# Patient Record
Sex: Female | Born: 1970 | Race: Black or African American | Hispanic: No | Marital: Married | Smoking: Never smoker
Health system: Southern US, Community
[De-identification: ages and names within clinical notes are randomized; demographics above are authoritative.]

## PROBLEM LIST (undated history)

## (undated) DIAGNOSIS — D509 Iron deficiency anemia, unspecified: Secondary | ICD-10-CM

## (undated) DIAGNOSIS — I509 Heart failure, unspecified: Secondary | ICD-10-CM

## (undated) DIAGNOSIS — I10 Essential (primary) hypertension: Secondary | ICD-10-CM

## (undated) DIAGNOSIS — Z91148 Patient's other noncompliance with medication regimen for other reason: Secondary | ICD-10-CM

## (undated) DIAGNOSIS — Q6211 Congenital occlusion of ureteropelvic junction: Secondary | ICD-10-CM

## (undated) DIAGNOSIS — R7989 Other specified abnormal findings of blood chemistry: Secondary | ICD-10-CM

## (undated) DIAGNOSIS — I429 Cardiomyopathy, unspecified: Secondary | ICD-10-CM

## (undated) DIAGNOSIS — Z9114 Patient's other noncompliance with medication regimen: Secondary | ICD-10-CM

## (undated) DIAGNOSIS — E119 Type 2 diabetes mellitus without complications: Secondary | ICD-10-CM

## (undated) DIAGNOSIS — N179 Acute kidney failure, unspecified: Secondary | ICD-10-CM

## (undated) DIAGNOSIS — I161 Hypertensive emergency: Secondary | ICD-10-CM

## (undated) DIAGNOSIS — I5041 Acute combined systolic (congestive) and diastolic (congestive) heart failure: Secondary | ICD-10-CM

## (undated) DIAGNOSIS — E1129 Type 2 diabetes mellitus with other diabetic kidney complication: Secondary | ICD-10-CM

## (undated) HISTORY — DX: Cardiomyopathy, unspecified: I42.9

## (undated) HISTORY — PX: CHOLECYSTECTOMY: SHX55

## (undated) HISTORY — DX: Type 2 diabetes mellitus with other diabetic kidney complication: E11.29

## (undated) HISTORY — DX: Congenital occlusion of ureteropelvic junction: Q62.11

## (undated) HISTORY — DX: Hypertensive emergency: I16.1

## (undated) HISTORY — DX: Heart failure, unspecified: I50.9

## (undated) HISTORY — DX: Other specified abnormal findings of blood chemistry: R79.89

## (undated) HISTORY — DX: Iron deficiency anemia, unspecified: D50.9

## (undated) HISTORY — DX: Acute kidney failure, unspecified: N17.9

## (undated) HISTORY — PX: OVARIAN CYST SURGERY: SHX726

## (undated) HISTORY — DX: Acute combined systolic (congestive) and diastolic (congestive) heart failure: I50.41

---

## 2010-02-15 ENCOUNTER — Ambulatory Visit (HOSPITAL_COMMUNITY)
Admission: RE | Admit: 2010-02-15 | Discharge: 2010-02-15 | Payer: Self-pay | Source: Home / Self Care | Attending: Ophthalmology | Admitting: Ophthalmology

## 2010-05-01 LAB — SURGICAL PCR SCREEN
MRSA, PCR: NEGATIVE
Staphylococcus aureus: NEGATIVE

## 2010-05-01 LAB — BASIC METABOLIC PANEL
BUN: 24 mg/dL — ABNORMAL HIGH (ref 6–23)
Calcium: 9.7 mg/dL (ref 8.4–10.5)
GFR calc Af Amer: 44 mL/min — ABNORMAL LOW (ref >60)
Potassium: 4.1 mEq/L (ref 3.5–5.1)
Sodium: 136 mEq/L (ref 135–145)

## 2010-05-01 LAB — GLUCOSE, CAPILLARY
Glucose-Capillary: 150 mg/dL — ABNORMAL HIGH (ref 70–99)
Glucose-Capillary: 162 mg/dL — ABNORMAL HIGH (ref 70–99)
Glucose-Capillary: 90 mg/dL (ref 70–99)

## 2010-05-01 LAB — CBC
Hemoglobin: 11.2 g/dL — ABNORMAL LOW (ref 12.0–15.0)
Platelets: 331 10*3/uL (ref 150–400)

## 2016-10-26 ENCOUNTER — Encounter (HOSPITAL_BASED_OUTPATIENT_CLINIC_OR_DEPARTMENT_OTHER): Payer: Self-pay | Admitting: *Deleted

## 2016-10-26 ENCOUNTER — Emergency Department (HOSPITAL_BASED_OUTPATIENT_CLINIC_OR_DEPARTMENT_OTHER): Payer: 59

## 2016-10-26 ENCOUNTER — Inpatient Hospital Stay (HOSPITAL_BASED_OUTPATIENT_CLINIC_OR_DEPARTMENT_OTHER)
Admission: EM | Admit: 2016-10-26 | Discharge: 2016-10-30 | DRG: 304 | Disposition: A | Discharge status: Home / Self Care | Payer: 59 | Attending: Internal Medicine | Admitting: Internal Medicine

## 2016-10-26 DIAGNOSIS — I248 Other forms of acute ischemic heart disease: Secondary | ICD-10-CM | POA: Diagnosis present

## 2016-10-26 DIAGNOSIS — I161 Hypertensive emergency: Principal | ICD-10-CM | POA: Diagnosis present

## 2016-10-26 DIAGNOSIS — N390 Urinary tract infection, site not specified: Secondary | ICD-10-CM | POA: Diagnosis not present

## 2016-10-26 DIAGNOSIS — D509 Iron deficiency anemia, unspecified: Secondary | ICD-10-CM | POA: Diagnosis present

## 2016-10-26 DIAGNOSIS — N186 End stage renal disease: Secondary | ICD-10-CM | POA: Diagnosis not present

## 2016-10-26 DIAGNOSIS — E1122 Type 2 diabetes mellitus with diabetic chronic kidney disease: Secondary | ICD-10-CM | POA: Diagnosis present

## 2016-10-26 DIAGNOSIS — N171 Acute kidney failure with acute cortical necrosis: Secondary | ICD-10-CM | POA: Diagnosis not present

## 2016-10-26 DIAGNOSIS — Z9119 Patient's noncompliance with other medical treatment and regimen: Secondary | ICD-10-CM | POA: Diagnosis not present

## 2016-10-26 DIAGNOSIS — I5041 Acute combined systolic (congestive) and diastolic (congestive) heart failure: Secondary | ICD-10-CM | POA: Diagnosis present

## 2016-10-26 DIAGNOSIS — N179 Acute kidney failure, unspecified: Secondary | ICD-10-CM | POA: Diagnosis present

## 2016-10-26 DIAGNOSIS — Q6211 Congenital occlusion of ureteropelvic junction: Secondary | ICD-10-CM

## 2016-10-26 DIAGNOSIS — J810 Acute pulmonary edema: Secondary | ICD-10-CM

## 2016-10-26 DIAGNOSIS — I5043 Acute on chronic combined systolic (congestive) and diastolic (congestive) heart failure: Secondary | ICD-10-CM | POA: Diagnosis not present

## 2016-10-26 DIAGNOSIS — I132 Hypertensive heart and chronic kidney disease with heart failure and with stage 5 chronic kidney disease, or end stage renal disease: Secondary | ICD-10-CM | POA: Diagnosis present

## 2016-10-26 DIAGNOSIS — D631 Anemia in chronic kidney disease: Secondary | ICD-10-CM | POA: Diagnosis present

## 2016-10-26 DIAGNOSIS — I120 Hypertensive chronic kidney disease with stage 5 chronic kidney disease or end stage renal disease: Secondary | ICD-10-CM | POA: Diagnosis not present

## 2016-10-26 DIAGNOSIS — N2581 Secondary hyperparathyroidism of renal origin: Secondary | ICD-10-CM | POA: Diagnosis present

## 2016-10-26 DIAGNOSIS — I509 Heart failure, unspecified: Secondary | ICD-10-CM

## 2016-10-26 DIAGNOSIS — I110 Hypertensive heart disease with heart failure: Secondary | ICD-10-CM | POA: Diagnosis not present

## 2016-10-26 DIAGNOSIS — Z9114 Patient's other noncompliance with medication regimen: Secondary | ICD-10-CM | POA: Diagnosis not present

## 2016-10-26 DIAGNOSIS — N136 Pyonephrosis: Secondary | ICD-10-CM | POA: Diagnosis present

## 2016-10-26 DIAGNOSIS — E872 Acidosis: Secondary | ICD-10-CM | POA: Diagnosis present

## 2016-10-26 DIAGNOSIS — Z79899 Other long term (current) drug therapy: Secondary | ICD-10-CM

## 2016-10-26 DIAGNOSIS — E1165 Type 2 diabetes mellitus with hyperglycemia: Secondary | ICD-10-CM | POA: Diagnosis present

## 2016-10-26 DIAGNOSIS — I5082 Biventricular heart failure: Secondary | ICD-10-CM | POA: Diagnosis present

## 2016-10-26 DIAGNOSIS — I340 Nonrheumatic mitral (valve) insufficiency: Secondary | ICD-10-CM | POA: Diagnosis not present

## 2016-10-26 DIAGNOSIS — E1121 Type 2 diabetes mellitus with diabetic nephropathy: Secondary | ICD-10-CM

## 2016-10-26 DIAGNOSIS — R7989 Other specified abnormal findings of blood chemistry: Secondary | ICD-10-CM

## 2016-10-26 DIAGNOSIS — R778 Other specified abnormalities of plasma proteins: Secondary | ICD-10-CM

## 2016-10-26 DIAGNOSIS — N170 Acute kidney failure with tubular necrosis: Secondary | ICD-10-CM | POA: Diagnosis not present

## 2016-10-26 DIAGNOSIS — I5021 Acute systolic (congestive) heart failure: Secondary | ICD-10-CM | POA: Diagnosis not present

## 2016-10-26 DIAGNOSIS — R748 Abnormal levels of other serum enzymes: Secondary | ICD-10-CM | POA: Diagnosis not present

## 2016-10-26 DIAGNOSIS — E1129 Type 2 diabetes mellitus with other diabetic kidney complication: Secondary | ICD-10-CM | POA: Diagnosis present

## 2016-10-26 DIAGNOSIS — D649 Anemia, unspecified: Secondary | ICD-10-CM | POA: Diagnosis not present

## 2016-10-26 DIAGNOSIS — N185 Chronic kidney disease, stage 5: Secondary | ICD-10-CM | POA: Diagnosis present

## 2016-10-26 HISTORY — DX: Essential (primary) hypertension: I10

## 2016-10-26 HISTORY — DX: Type 2 diabetes mellitus without complications: E11.9

## 2016-10-26 HISTORY — DX: Patient's other noncompliance with medication regimen for other reason: Z91.148

## 2016-10-26 HISTORY — DX: Patient's other noncompliance with medication regimen: Z91.14

## 2016-10-26 LAB — CBG MONITORING, ED: GLUCOSE-CAPILLARY: 157 mg/dL — AB (ref 65–99)

## 2016-10-26 LAB — COMPREHENSIVE METABOLIC PANEL
ALK PHOS: 110 U/L (ref 38–126)
ALT: 28 U/L (ref 14–54)
AST: 23 U/L (ref 15–41)
Albumin: 4.5 g/dL (ref 3.5–5.0)
Anion gap: 14 (ref 5–15)
BUN: 61 mg/dL — ABNORMAL HIGH (ref 6–20)
CALCIUM: 9.8 mg/dL (ref 8.9–10.3)
CHLORIDE: 103 mmol/L (ref 101–111)
CO2: 19 mmol/L — AB (ref 22–32)
CREATININE: 5.91 mg/dL — AB (ref 0.44–1.00)
GFR calc Af Amer: 9 mL/min — ABNORMAL LOW (ref >60)
GFR calc non Af Amer: 8 mL/min — ABNORMAL LOW (ref >60)
GLUCOSE: 194 mg/dL — AB (ref 65–99)
Potassium: 3.4 mmol/L — ABNORMAL LOW (ref 3.5–5.1)
SODIUM: 136 mmol/L (ref 135–145)
TOTAL PROTEIN: 8.2 g/dL — AB (ref 6.5–8.1)
Total Bilirubin: 1.4 mg/dL — ABNORMAL HIGH (ref 0.3–1.2)

## 2016-10-26 LAB — CBC WITH DIFFERENTIAL/PLATELET
BASOS ABS: 0 10*3/uL (ref 0.0–0.1)
Basophils Relative: 0 %
EOS ABS: 0.1 10*3/uL (ref 0.0–0.7)
EOS PCT: 1 %
HCT: 33.5 % — ABNORMAL LOW (ref 36.0–46.0)
HEMOGLOBIN: 11.1 g/dL — AB (ref 12.0–15.0)
LYMPHS PCT: 22 %
Lymphs Abs: 2.2 10*3/uL (ref 0.7–4.0)
MCH: 23.8 pg — ABNORMAL LOW (ref 26.0–34.0)
MCHC: 33.1 g/dL (ref 30.0–36.0)
MCV: 71.7 fL — AB (ref 78.0–100.0)
Monocytes Absolute: 0.6 10*3/uL (ref 0.1–1.0)
Monocytes Relative: 6 %
NEUTROS PCT: 71 %
Neutro Abs: 7.1 10*3/uL (ref 1.7–7.7)
PLATELETS: 323 10*3/uL (ref 150–400)
RBC: 4.67 MIL/uL (ref 3.87–5.11)
RDW: 16.6 % — ABNORMAL HIGH (ref 11.5–15.5)
WBC: 10 10*3/uL (ref 4.0–10.5)

## 2016-10-26 LAB — D-DIMER, QUANTITATIVE: D-Dimer, Quant: 1.36 ug/mL-FEU — ABNORMAL HIGH (ref 0.00–0.50)

## 2016-10-26 LAB — TROPONIN I: Troponin I: 0.25 ng/mL (ref <0.03)

## 2016-10-26 LAB — LIPASE, BLOOD: Lipase: 44 U/L (ref 11–51)

## 2016-10-26 LAB — BRAIN NATRIURETIC PEPTIDE

## 2016-10-26 MED ORDER — LABETALOL HCL 5 MG/ML IV SOLN
0.5000 mg/min | INTRAVENOUS | Status: DC
  Administered 2016-10-26: 0.5 mg/min via INTRAVENOUS
  Filled 2016-10-26: qty 100

## 2016-10-26 MED ORDER — NITROGLYCERIN IN D5W 200-5 MCG/ML-% IV SOLN
0.0000 ug/min | Freq: Once | INTRAVENOUS | Status: AC
  Administered 2016-10-26: 5 ug/min via INTRAVENOUS
  Filled 2016-10-26: qty 250

## 2016-10-26 MED ORDER — METOPROLOL TARTRATE 5 MG/5ML IV SOLN
5.0000 mg | Freq: Once | INTRAVENOUS | Status: AC
  Administered 2016-10-26: 5 mg via INTRAVENOUS
  Filled 2016-10-26: qty 5

## 2016-10-26 MED ORDER — ASPIRIN 81 MG PO CHEW
324.0000 mg | CHEWABLE_TABLET | Freq: Once | ORAL | Status: AC
  Administered 2016-10-26: 324 mg via ORAL
  Filled 2016-10-26: qty 4

## 2016-10-26 MED ORDER — NITROGLYCERIN 0.4 MG SL SUBL
0.4000 mg | SUBLINGUAL_TABLET | SUBLINGUAL | Status: DC | PRN
  Filled 2016-10-26 (×2): qty 1

## 2016-10-26 NOTE — ED Notes (Signed)
MD made aware of target BP reached, informed to continue labetalol drip at this time.

## 2016-10-26 NOTE — ED Notes (Signed)
Date and time results received: 10/26/16 2110 Face to face  Test: troponin Critical Value: 0.25  Name of Provider Notified: Dr. Lita Mains  Orders Received? Or Actions Taken?: Dr. Lita Mains to room

## 2016-10-26 NOTE — ED Notes (Signed)
Pt. Feels full when she eats only a few bites of food and her abd. Begins to hurt also.  Pt. Does drink fluids.

## 2016-10-26 NOTE — ED Notes (Signed)
[facility] notified - hospitalist consult @ [facility]. Cancelled transport from [place] - Paged hospitalist @ [facility] - Dr. Lita Mains will cancel admission

## 2016-10-26 NOTE — ED Notes (Signed)
ED Provider at bedside. 

## 2016-10-26 NOTE — ED Notes (Signed)
EDP in to reassess the Pt. And aware of Pt. Troponin level.  Pt. In no distress.

## 2016-10-26 NOTE — ED Triage Notes (Signed)
Abdominal pain and SOB for a week. She took her BP tonight and it was elevated. She stopped taking her BP and Diabetic medications 2 years ago.

## 2016-10-26 NOTE — ED Provider Notes (Signed)
[facility]PT [facility] Provider Note   CSN: [id number] Arrival date & time: 10/26/16  1942     History   Chief Complaint Chief Complaint  Patient presents with  . Abdominal Pain    HPI Mallory Collier is a 46 y.o. female.  HPI Patient states she's been off of her diabetic and hypertensive medication for the past 2 years. She's had 2 weeks of upper abdominal discomfort and fullness. She's also had shortness of breath worse with exertion. Denies cough, fever or chills. No new lower extremity swelling or pain. Denies chest pain or tightness. States she's had early satiety due to upper abdominal discomfort. Took her blood pressure today and it was elevated. Denies use of stimulants. She has had 5 pound weight loss this week. Past Medical History:  Diagnosis Date  . Diabetes mellitus without complication ([place])   . Hypertension   . Noncompliance with medication regimen     Patient Active Problem List   Diagnosis Date Noted  . Hypertensive urgency 10/26/2016    Past Surgical History:  Procedure Laterality Date  . CHOLECYSTECTOMY    . OVARIAN CYST SURGERY      OB History    No data available       Home Medications    Prior to Admission medications   Not on File    Family History No family history on file.  Social History Social History  Substance Use Topics  . Smoking status: Never Smoker  . Smokeless tobacco: Never Used  . Alcohol use No     Allergies   Patient has no known allergies.   Review of Systems Review of Systems  Constitutional: Positive for activity change, appetite change and fatigue. Negative for chills and fever.  HENT: Negative for congestion, sinus pain and sore throat.   Eyes: Negative for visual disturbance.  Respiratory: Positive for shortness of breath. Negative for cough and wheezing.   Cardiovascular: Positive for palpitations. Negative for chest pain and leg swelling.  Gastrointestinal: Positive for abdominal distention,  abdominal pain, constipation and nausea. Negative for diarrhea and vomiting.  Genitourinary: Negative for dysuria, flank pain, frequency and hematuria.  Musculoskeletal: Negative for back pain, myalgias, neck pain and neck stiffness.  Skin: Negative for rash and wound.  Neurological: Negative for dizziness, weakness, light-headedness, numbness and headaches.  All other systems reviewed and are negative.    Physical Exam Updated Vital Signs BP (!) 169/108 (BP Location: Right Arm)   Pulse 70   Temp 98.2 F (36.8 C) (Oral)   Resp 18   Ht 5\' 5"  (1.651 m)   Wt 74.8 kg (165 lb)   LMP 10/12/2016   SpO2 100%   BMI 27.46 kg/m   Physical Exam  Constitutional: She is oriented to person, place, and time. She appears well-developed and well-nourished. No distress.  HENT:  Head: Normocephalic and atraumatic.  Mouth/Throat: Oropharynx is clear and moist. No oropharyngeal exudate.  Eyes: Pupils are equal, round, and reactive to light. EOM are normal.  Neck: Normal range of motion. Neck supple. No JVD present. No thyromegaly present.  Cardiovascular: Regular rhythm.  Exam reveals no gallop and no friction rub.   No murmur heard. Tachycardia  Pulmonary/Chest: Effort normal and breath sounds normal. No respiratory distress. She has no wheezes. She has no rales. She exhibits no tenderness.  Abdominal: Soft. Bowel sounds are normal. She exhibits no distension and no mass. There is no tenderness. There is no rebound and no guarding. No hernia.  Musculoskeletal: Normal range  of motion. She exhibits no edema or tenderness.  No lower extremity swelling or asymmetry. Distal pulses are 2+. No midline thoracic or lumbar tenderness. No CVA tenderness.  Lymphadenopathy:    She has no cervical adenopathy.  Neurological: She is alert and oriented to person, place, and time.  Meloxicam and is without focal deficit. Sensation fully intact.  Skin: Skin is warm and dry. No rash noted. She is not diaphoretic. No  erythema.  Psychiatric: She has a normal mood and affect. Her behavior is normal.  Nursing note and vitals reviewed.    ED Treatments / Results  Labs (all labs ordered are listed, but only abnormal results are displayed) Labs Reviewed  CBC WITH DIFFERENTIAL/PLATELET - Abnormal; Notable for the following:       Result Value   Hemoglobin 11.1 (*)    HCT 33.5 (*)    MCV 71.7 (*)    MCH 23.8 (*)    RDW 16.6 (*)    All other components within normal limits  COMPREHENSIVE METABOLIC PANEL - Abnormal; Notable for the following:    Potassium 3.4 (*)    CO2 19 (*)    Glucose, Bld 194 (*)    BUN 61 (*)    Creatinine, Ser 5.91 (*)    Total Protein 8.2 (*)    Total Bilirubin 1.4 (*)    GFR calc non Af Amer 8 (*)    GFR calc Af Amer 9 (*)    All other components within normal limits  TROPONIN I - Abnormal; Notable for the following:    Troponin I 0.25 (*)    All other components within normal limits  BRAIN NATRIURETIC PEPTIDE - Abnormal; Notable for the following:    B Natriuretic Peptide >4,500.0 (*)    All other components within normal limits  D-DIMER, QUANTITATIVE (NOT AT [facility]) - Abnormal; Notable for the following:    D-Dimer, Quant 1.36 (*)    All other components within normal limits  CBG MONITORING, ED - Abnormal; Notable for the following:    Glucose-Capillary 157 (*)    All other components within normal limits  LIPASE, BLOOD  TSH  RAPID URINE DRUG SCREEN, HOSP PERFORMED  T4, FREE  URINALYSIS, ROUTINE W REFLEX MICROSCOPIC  CBG MONITORING, ED    EKG  EKG Interpretation None       Radiology Dg Chest Port 1 View  Result Date: 10/26/2016 CLINICAL DATA:  46 year old female with history of acute onset of hypertension today. EXAM: PORTABLE CHEST 1 VIEW COMPARISON:  Chest x-ray a 02/07/2010. FINDINGS: There is mild cephalization of the pulmonary vasculature and slight indistinctness of the interstitial markings suggestive of mild pulmonary edema. No acute consolidative  airspace disease. No definite pleural effusions. Mild cardiomegaly. The patient is rotated to the left on today's exam, resulting in distortion of the mediastinal contours and reduced diagnostic sensitivity and specificity for mediastinal pathology. IMPRESSION: 1. The appearance of the chest suggests very mild congestive heart failure, as above. Electronically Signed   By: Vinnie Langton M.D.   On: 10/26/2016 21:03    Procedures Procedures (including critical care time)  Medications Ordered in ED Medications  nitroGLYCERIN (NITROSTAT) SL tablet 0.4 mg (0 mg Sublingual Hold 10/26/16 2122)  metoprolol tartrate (LOPRESSOR) injection 5 mg (5 mg Intravenous Given 10/26/16 2042)  aspirin chewable tablet 324 mg (324 mg Oral Given 10/26/16 2040)  nitroGLYCERIN 50 mg in dextrose 5 % 250 mL (0.2 mg/mL) infusion (85 mcg/min Intravenous Rate/Dose Change 10/27/16 0014)   CRITICAL  CARE Performed by: Julianne Rice Total critical care time: 40 minutes Critical care time was exclusive of separately billable procedures and treating other patients. Critical care was necessary to treat or prevent imminent or life-threatening deterioration. Critical care was time spent personally by me on the following activities: development of treatment plan with patient and/or surrogate as well as nursing, discussions with consultants, evaluation of patient's response to treatment, examination of patient, obtaining history from patient or surrogate, ordering and performing treatments and interventions, ordering and review of laboratory studies, ordering and review of radiographic studies, pulse oximetry and re-evaluation of patient's condition.  Initial Impression / Assessment and Plan / ED Course  I have reviewed the triage vital signs and the nursing notes.  Pertinent labs & imaging results that were available during my care of the patient were reviewed by me and considered in my medical decision making (see chart for  details).    EKG with concerning findings of ST segment depression in lateral and inferior leads. There is small amount of anterior elevation which appears to be chronic and similar to previous EKG.  Started on labetalol drip with improvement of blood pressure. Continues to deny any chest pain. She has elevation of troponin and acute renal failure with evidence of pulmonary edema on chest x-ray. Discussed with hospitalist who will accept to [facility] step down unit. Suggests switching labetalol drip to nitroglycerin drip. UA is pending.  Final Clinical Impressions(s) / ED Diagnoses   Final diagnoses:  AKI (acute kidney injury) ([place])  Acute pulmonary edema (H[facility])  Elevated troponin  Hypertensive emergency    New Prescriptions New Prescriptions   No medications on file     Julianne Rice, MD 10/27/16 [phone]

## 2016-10-26 NOTE — ED Notes (Signed)
Patient denies pain and is resting comfortably.  

## 2016-10-26 NOTE — ED Notes (Signed)
Pt. Is in no distress and has no resp. Issues.

## 2016-10-26 NOTE — ED Notes (Signed)
Target B/P is noted on med instructions.

## 2016-10-27 ENCOUNTER — Encounter (HOSPITAL_COMMUNITY): Payer: Self-pay | Admitting: Internal Medicine

## 2016-10-27 ENCOUNTER — Inpatient Hospital Stay (HOSPITAL_COMMUNITY): Payer: 59

## 2016-10-27 DIAGNOSIS — E1121 Type 2 diabetes mellitus with diabetic nephropathy: Secondary | ICD-10-CM

## 2016-10-27 DIAGNOSIS — N390 Urinary tract infection, site not specified: Secondary | ICD-10-CM

## 2016-10-27 DIAGNOSIS — N170 Acute kidney failure with tubular necrosis: Secondary | ICD-10-CM

## 2016-10-27 DIAGNOSIS — I509 Heart failure, unspecified: Secondary | ICD-10-CM

## 2016-10-27 DIAGNOSIS — I161 Hypertensive emergency: Secondary | ICD-10-CM

## 2016-10-27 DIAGNOSIS — E1129 Type 2 diabetes mellitus with other diabetic kidney complication: Secondary | ICD-10-CM

## 2016-10-27 DIAGNOSIS — R7989 Other specified abnormal findings of blood chemistry: Secondary | ICD-10-CM

## 2016-10-27 DIAGNOSIS — D509 Iron deficiency anemia, unspecified: Secondary | ICD-10-CM | POA: Diagnosis present

## 2016-10-27 DIAGNOSIS — D649 Anemia, unspecified: Secondary | ICD-10-CM

## 2016-10-27 DIAGNOSIS — R778 Other specified abnormalities of plasma proteins: Secondary | ICD-10-CM

## 2016-10-27 DIAGNOSIS — I340 Nonrheumatic mitral (valve) insufficiency: Secondary | ICD-10-CM

## 2016-10-27 DIAGNOSIS — N179 Acute kidney failure, unspecified: Secondary | ICD-10-CM

## 2016-10-27 HISTORY — DX: Type 2 diabetes mellitus with other diabetic kidney complication: E11.29

## 2016-10-27 HISTORY — DX: Other specified abnormal findings of blood chemistry: R79.89

## 2016-10-27 HISTORY — DX: Iron deficiency anemia, unspecified: D50.9

## 2016-10-27 HISTORY — DX: Acute kidney failure, unspecified: N17.9

## 2016-10-27 HISTORY — DX: Other specified abnormalities of plasma proteins: R77.8

## 2016-10-27 HISTORY — DX: Hypertensive emergency: I16.1

## 2016-10-27 HISTORY — DX: Heart failure, unspecified: I50.9

## 2016-10-27 LAB — URINALYSIS, ROUTINE W REFLEX MICROSCOPIC
Bilirubin Urine: NEGATIVE
Glucose, UA: 250 mg/dL — AB
Ketones, ur: NEGATIVE mg/dL
Nitrite: NEGATIVE
Protein, ur: >300 mg/dL — AB
SPECIFIC GRAVITY, URINE: 1.02 (ref 1.005–1.030)
pH: 6 (ref 5.0–8.0)

## 2016-10-27 LAB — GLUCOSE, CAPILLARY
GLUCOSE-CAPILLARY: 137 mg/dL — AB (ref 65–99)
GLUCOSE-CAPILLARY: 149 mg/dL — AB (ref 65–99)
Glucose-Capillary: 148 mg/dL — ABNORMAL HIGH (ref 65–99)
Glucose-Capillary: 133 mg/dL — ABNORMAL HIGH (ref 65–99)

## 2016-10-27 LAB — ECHOCARDIOGRAM COMPLETE
Height: 65 in
Weight: 2543.23 oz

## 2016-10-27 LAB — CBC WITH DIFFERENTIAL/PLATELET
Basophils Absolute: 0 10*3/uL (ref 0.0–0.1)
Basophils Relative: 1 %
Eosinophils Absolute: 0.1 10*3/uL (ref 0.0–0.7)
Eosinophils Relative: 1 %
HEMATOCRIT: 30.6 % — AB (ref 36.0–46.0)
HEMOGLOBIN: 9.5 g/dL — AB (ref 12.0–15.0)
LYMPHS ABS: 1.9 10*3/uL (ref 0.7–4.0)
LYMPHS PCT: 22 %
MCH: 22.9 pg — AB (ref 26.0–34.0)
MCHC: 31 g/dL (ref 30.0–36.0)
MCV: 73.7 fL — AB (ref 78.0–100.0)
MONO ABS: 0.4 10*3/uL (ref 0.1–1.0)
MONOS PCT: 5 %
NEUTROS ABS: 6.2 10*3/uL (ref 1.7–7.7)
Neutrophils Relative %: 71 %
Platelets: 260 10*3/uL (ref 150–400)
RBC: 4.15 MIL/uL (ref 3.87–5.11)
RDW: 16.7 % — AB (ref 11.5–15.5)
WBC: 8.7 10*3/uL (ref 4.0–10.5)

## 2016-10-27 LAB — HEPATIC FUNCTION PANEL
ALT: 25 U/L (ref 14–54)
AST: 22 U/L (ref 15–41)
Albumin: 3.8 g/dL (ref 3.5–5.0)
Alkaline Phosphatase: 103 U/L (ref 38–126)
BILIRUBIN DIRECT: 0.3 mg/dL (ref 0.1–0.5)
Indirect Bilirubin: 1.2 mg/dL — ABNORMAL HIGH (ref 0.3–0.9)
TOTAL PROTEIN: 7.4 g/dL (ref 6.5–8.1)
Total Bilirubin: 1.5 mg/dL — ABNORMAL HIGH (ref 0.3–1.2)

## 2016-10-27 LAB — TSH: TSH: 1.901 u[IU]/mL (ref 0.350–4.500)

## 2016-10-27 LAB — TROPONIN I
TROPONIN I: 0.47 ng/mL — AB (ref <0.03)
Troponin I: 0.24 ng/mL (ref <0.03)
Troponin I: 0.25 ng/mL (ref <0.03)
Troponin I: 0.34 ng/mL (ref <0.03)

## 2016-10-27 LAB — RAPID URINE DRUG SCREEN, HOSP PERFORMED
AMPHETAMINES: NOT DETECTED
BENZODIAZEPINES: NOT DETECTED
Barbiturates: NOT DETECTED
Cocaine: NOT DETECTED
OPIATES: NOT DETECTED
Tetrahydrocannabinol: NOT DETECTED

## 2016-10-27 LAB — IRON AND TIBC
Iron: 26 ug/dL — ABNORMAL LOW (ref 28–170)
Saturation Ratios: 6 % — ABNORMAL LOW (ref 10.4–31.8)
TIBC: 402 ug/dL (ref 250–450)
UIBC: 376 ug/dL

## 2016-10-27 LAB — HEMOGLOBIN A1C
Hgb A1c MFr Bld: 6.3 % — ABNORMAL HIGH (ref 4.8–5.6)
MEAN PLASMA GLUCOSE: 134.11 mg/dL

## 2016-10-27 LAB — RETICULOCYTES
RBC.: 4.15 MIL/uL (ref 3.87–5.11)
Retic Count, Absolute: 83 10*3/uL (ref 19.0–186.0)
Retic Ct Pct: 2 % (ref 0.4–3.1)

## 2016-10-27 LAB — CREATININE, URINE, RANDOM: Creatinine, Urine: 117.26 mg/dL

## 2016-10-27 LAB — URINALYSIS, MICROSCOPIC (REFLEX)

## 2016-10-27 LAB — LIPASE, BLOOD: Lipase: 46 U/L (ref 11–51)

## 2016-10-27 LAB — VITAMIN B12: VITAMIN B 12: 1409 pg/mL — AB (ref 180–914)

## 2016-10-27 LAB — BASIC METABOLIC PANEL
ANION GAP: 15 (ref 5–15)
BUN: 61 mg/dL — ABNORMAL HIGH (ref 6–20)
CALCIUM: 9.7 mg/dL (ref 8.9–10.3)
CHLORIDE: 105 mmol/L (ref 101–111)
CO2: 17 mmol/L — AB (ref 22–32)
Creatinine, Ser: 6.2 mg/dL — ABNORMAL HIGH (ref 0.44–1.00)
GFR calc Af Amer: 8 mL/min — ABNORMAL LOW (ref >60)
GFR calc non Af Amer: 7 mL/min — ABNORMAL LOW (ref >60)
GLUCOSE: 155 mg/dL — AB (ref 65–99)
Potassium: 3.7 mmol/L (ref 3.5–5.1)
Sodium: 137 mmol/L (ref 135–145)

## 2016-10-27 LAB — CK: Total CK: 72 U/L (ref 38–234)

## 2016-10-27 LAB — FERRITIN: FERRITIN: 21 ng/mL (ref 11–307)

## 2016-10-27 LAB — T4, FREE: Free T4: 1.39 ng/dL — ABNORMAL HIGH (ref 0.61–1.12)

## 2016-10-27 LAB — FOLATE: Folate: 31 ng/mL (ref >5.9)

## 2016-10-27 LAB — HIV ANTIBODY (ROUTINE TESTING W REFLEX): HIV SCREEN 4TH GENERATION: NONREACTIVE

## 2016-10-27 LAB — PREGNANCY, URINE: PREG TEST UR: NEGATIVE

## 2016-10-27 LAB — MRSA PCR SCREENING: MRSA by PCR: NEGATIVE

## 2016-10-27 LAB — PROTEIN / CREATININE RATIO, URINE
CREATININE, URINE: 110.61 mg/dL
Protein Creatinine Ratio: 2.05 mg/mg{Cre} — ABNORMAL HIGH (ref 0.00–0.15)
Total Protein, Urine: 227 mg/dL

## 2016-10-27 LAB — SODIUM, URINE, RANDOM: SODIUM UR: 44 mmol/L

## 2016-10-27 LAB — PHOSPHORUS: Phosphorus: 4.1 mg/dL (ref 2.5–4.6)

## 2016-10-27 LAB — HCG, SERUM, QUALITATIVE: PREG SERUM: NEGATIVE

## 2016-10-27 MED ORDER — SODIUM CHLORIDE 0.9 % IV SOLN
125.0000 mg | Freq: Once | INTRAVENOUS | Status: AC
  Administered 2016-10-27: 125 mg via INTRAVENOUS
  Filled 2016-10-27: qty 10

## 2016-10-27 MED ORDER — LABETALOL HCL 200 MG PO TABS
200.0000 mg | ORAL_TABLET | Freq: Two times a day (BID) | ORAL | Status: DC
  Administered 2016-10-27 – 2016-10-28 (×2): 200 mg via ORAL
  Filled 2016-10-27 (×2): qty 1

## 2016-10-27 MED ORDER — LABETALOL HCL 100 MG PO TABS
100.0000 mg | ORAL_TABLET | Freq: Two times a day (BID) | ORAL | Status: DC
  Administered 2016-10-27: 100 mg via ORAL
  Filled 2016-10-27: qty 1

## 2016-10-27 MED ORDER — NITROGLYCERIN IN D5W 200-5 MCG/ML-% IV SOLN
0.0000 ug/min | Freq: Once | INTRAVENOUS | Status: AC
  Administered 2016-10-27: 150 ug/min via INTRAVENOUS

## 2016-10-27 MED ORDER — HYDRALAZINE HCL 50 MG PO TABS
25.0000 mg | ORAL_TABLET | Freq: Three times a day (TID) | ORAL | Status: DC
  Administered 2016-10-27 – 2016-10-29 (×7): 25 mg via ORAL
  Filled 2016-10-27 (×7): qty 1

## 2016-10-27 MED ORDER — ACETAMINOPHEN 325 MG PO TABS: 650.0000 mg | ORAL_TABLET | Freq: Four times a day (QID) | ORAL | Status: DC | PRN

## 2016-10-27 MED ORDER — AMLODIPINE BESYLATE 10 MG PO TABS
10.0000 mg | ORAL_TABLET | Freq: Every day | ORAL | Status: DC
  Administered 2016-10-27 – 2016-10-30 (×4): 10 mg via ORAL
  Filled 2016-10-27 (×4): qty 1

## 2016-10-27 MED ORDER — LABETALOL HCL 100 MG PO TABS: 150.0000 mg | ORAL_TABLET | Freq: Two times a day (BID) | ORAL | Status: DC

## 2016-10-27 MED ORDER — INSULIN ASPART 100 UNIT/ML ~~LOC~~ SOLN
0.0000 [IU] | Freq: Three times a day (TID) | SUBCUTANEOUS | Status: DC
  Administered 2016-10-27 – 2016-10-29 (×4): 1 [IU] via SUBCUTANEOUS

## 2016-10-27 MED ORDER — DEXTROSE 5 % IV SOLN
1.0000 g | INTRAVENOUS | Status: DC
  Administered 2016-10-27 – 2016-10-29 (×3): 1 g via INTRAVENOUS
  Filled 2016-10-27 (×3): qty 10

## 2016-10-27 MED ORDER — ACETAMINOPHEN 650 MG RE SUPP: 650.0000 mg | Freq: Four times a day (QID) | RECTAL | Status: DC | PRN

## 2016-10-27 MED ORDER — HEPARIN SODIUM (PORCINE) 5000 UNIT/ML IJ SOLN
5000.0000 [IU] | Freq: Three times a day (TID) | INTRAMUSCULAR | Status: DC
  Administered 2016-10-27 – 2016-10-30 (×8): 5000 [IU] via SUBCUTANEOUS
  Filled 2016-10-27 (×8): qty 1

## 2016-10-27 NOTE — Progress Notes (Signed)
ANTICOAGULATION CONSULT NOTE  Pharmacy Consult for heparin Indication: VTE prophylaxis   Assessment: 37 yof admitted with abd discomfort and elevated BP. Pharmacy consulted to dose heparin for VTE prophylaxis. Not on AC pta. Hg down a bit, plt wnl. No bleed documented.  Goal of Therapy:  VTE prevention Monitor platelets by anticoagulation protocol: Yes   Plan:  Heparin 5000 units Metzger q8h Monitor CBC, s/sx bleeding  Elicia Lamp, PharmD, BCPS Clinical Pharmacist 10/27/2016 7:38 PM

## 2016-10-27 NOTE — ED Notes (Signed)
EDP made aware of BP and nitro rate. EDP advised to stop nitro and continue to monitor BP. Pt informed of bed status. Pt has no pain or complaints at this time. Resp e/u. Pt in NAD.

## 2016-10-27 NOTE — H&P (Signed)
History and Physical    Lively Haberman HFW:[id number] DOB: [birth year] DOA: 10/26/2016  PCP: Patient, No Pcp Per  Patient coming from: Home.  Chief Complaint: Abdominal pain and elevated blood pressure.  HPI: Mallory Collier is a 46 y.o. female with history of hypertension and diabetes mellitus has not been to her PCP for more than 2 years presents to the ER with complaints of abdominal discomfort and elevated blood pressure. Patient states over the last 3 days patient has been having abdominal discomfort mostly in the epigastric area. Denies any nausea vomiting or diarrhea. Over the last 2 weeks patient also has been experiencing exertional shortness of breath. Denies any chest pain and productive cough fever or chills. For the last 3 days patient has been taking Aleve for the abdominal discomfort. Though patient has not followed up with her primary care she at times takes Diovan which she still has with her for her blood pressure when she feels that her blood pressure may be running high. After long patient checked her blood pressure yesterday and was found to be elevated.   ED Course: In the ER patient was found to have a systolic blood pressure more than 200 and labs revealed mildly elevated troponin with EKG showing sinus tachycardia and chest x-ray showing congestion. Patient has markedly elevated creatinine the last known creatinine in our system is in 2011 was 1.5. Creatinine today was around 5.9. In addition d-dimer was elevated. UA shows features concerning for UTI. Abdomen on exam his appearing benign. Patient was initially started on labetalol drip and changed to nitroglycerin infusion. At the time of my exam patient's blood pressure is around 180/110. Not in distress. Patient is being admitted for further management of hypertensive emergency with renal failure and possible CHF and elevated troponin and d-dimer.  Review of Systems: As per HPI, rest all negative.   Past Medical History:    Diagnosis Date  . Diabetes mellitus without complication ([place])   . Hypertension   . Noncompliance with medication regimen     Past Surgical History:  Procedure Laterality Date  . CHOLECYSTECTOMY    . OVARIAN CYST SURGERY       reports that she has never smoked. She has never used smokeless tobacco. She reports that she does not drink alcohol or use drugs.  No Known Allergies  Family History  Problem Relation Age of Onset  . Congestive Heart Failure Father     Prior to Admission medications   Not on File    Physical Exam: Vitals:   10/27/16 0142 10/27/16 0200 10/27/16 0230 10/27/16 0342  BP: (!) 170/111 (!) 156/113 (!) 160/101 (!) 176/118  Pulse: 73 72 72 80  Resp: 12 17 20 15   Temp:    98 F (36.7 C)  TempSrc:    Oral  SpO2: 99% 95% 94% 97%  Weight:    72.1 kg (158 lb 15.2 oz)  Height:    5\' 5"  (1.651 m)      Constitutional: Moderately built and nourished. Vitals:   10/27/16 0142 10/27/16 0200 10/27/16 0230 10/27/16 0342  BP: (!) 170/111 (!) 156/113 (!) 160/101 (!) 176/118  Pulse: 73 72 72 80  Resp: 12 17 20 15   Temp:    98 F (36.7 C)  TempSrc:    Oral  SpO2: 99% 95% 94% 97%  Weight:    72.1 kg (158 lb 15.2 oz)  Height:    5\' 5"  (1.651 m)   Eyes: Anicteric no pallor. ENMT: No discharge from  the ears eyes nose and mouth. Neck: No mass felt. No neck rigidity. JVD elevated. Respiratory: No rhonchi or crepitations. Cardiovascular: S1-S2 heard no murmurs appreciated. Abdomen: Soft nontender bowel sounds present. No guarding or rigidity. Musculoskeletal: No edema. No joint effusion. Skin: No rash. Skin appears warm. Neurologic: Alert awake oriented to time place and person. Moves all extremities. Psychiatric: Appears normal. Normal affect.   Labs on Admission: I have personally reviewed following labs and imaging studies  CBC:  Recent Labs Lab 10/26/16 2026  WBC 10.0  NEUTROABS 7.1  HGB 11.1*  HCT 33.5*  MCV 71.7*  PLT 810   Basic Metabolic  Panel:  Recent Labs Lab 10/26/16 2026  NA 136  K 3.4*  CL 103  CO2 19*  GLUCOSE 194*  BUN 61*  CREATININE 5.91*  CALCIUM 9.8   GFR: Estimated Creatinine Clearance: 11.8 mL/min (A) (by C-G formula based on SCr of 5.91 mg/dL (H)). Liver Function Tests:  Recent Labs Lab 10/26/16 2026  AST 23  ALT 28  ALKPHOS 110  BILITOT 1.4*  PROT 8.2*  ALBUMIN 4.5    Recent Labs Lab 10/26/16 2026  LIPASE 44   No results for input(s): AMMONIA in the last 168 hours. Coagulation Profile: No results for input(s): INR, PROTIME in the last 168 hours. Cardiac Enzymes:  Recent Labs Lab 10/26/16 2026 10/27/16 0300  TROPONINI 0.25* 0.24*   BNP (last 3 results) No results for input(s): PROBNP in the last 8760 hours. HbA1C: No results for input(s): HGBA1C in the last 72 hours. CBG:  Recent Labs Lab 10/26/16 1947  GLUCAP 157*   Lipid Profile: No results for input(s): CHOL, HDL, LDLCALC, TRIG, CHOLHDL, LDLDIRECT in the last 72 hours. Thyroid Function Tests:  Recent Labs  10/26/16 2026  TSH 1.901  FREET4 1.39*   Anemia Panel: No results for input(s): VITAMINB12, FOLATE, FERRITIN, TIBC, IRON, RETICCTPCT in the last 72 hours. Urine analysis:    Component Value Date/Time   COLORURINE YELLOW 10/27/2016 0120   APPEARANCEUR CLOUDY (A) 10/27/2016 0120   LABSPEC 1.020 10/27/2016 0120   PHURINE 6.0 10/27/2016 0120   GLUCOSEU 250 (A) 10/27/2016 0120   HGBUR MODERATE (A) 10/27/2016 0120   BILIRUBINUR NEGATIVE 10/27/2016 0120   KETONESUR NEGATIVE 10/27/2016 0120   PROTEINUR >300 (A) 10/27/2016 0120   NITRITE NEGATIVE 10/27/2016 0120   LEUKOCYTESUR SMALL (A) 10/27/2016 0120   Sepsis Labs: @LABRCNTIP (procalcitonin:4,lacticidven:4) )No results found for this or any previous visit (from the past 240 hour(s)).   Radiological Exams on Admission: Dg Chest Port 1 View  Result Date: 10/26/2016 CLINICAL DATA:  46 year old female with history of acute onset of hypertension today.  EXAM: PORTABLE CHEST 1 VIEW COMPARISON:  Chest x-ray a 02/07/2010. FINDINGS: There is mild cephalization of the pulmonary vasculature and slight indistinctness of the interstitial markings suggestive of mild pulmonary edema. No acute consolidative airspace disease. No definite pleural effusions. Mild cardiomegaly. The patient is rotated to the left on today's exam, resulting in distortion of the mediastinal contours and reduced diagnostic sensitivity and specificity for mediastinal pathology. IMPRESSION: 1. The appearance of the chest suggests very mild congestive heart failure, as above. Electronically Signed   By: Vinnie Langton M.D.   On: 10/26/2016 21:03    EKG: Independently reviewed. Sinus tachycardia.  Assessment/Plan Principal Problem:   Hypertensive emergency Active Problems:   Elevated troponin   AKI (acute kidney injury) ([place])   CHF (congestive heart failure) (H[facility])   Type 2 diabetes mellitus with renal manifestations ([place])  Microcytic hypochromic anemia    1. Hypertensive emergency - patient is placed on nitroglycerin infusion. I have started patient on hydralazine by mouth 25 mg 3 times a day along with labetalol 100 mg by mouth twice a day. Slowly wean off nitroglycerin infusion and may have to go up on the medications. 2. 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 Patient did take Diovan occasionally and also recently has been taking Aleve. Check FENa. Hold any nephrotoxic medications. Closely follow intake output metabolic panel. Will check SPEP and will CT of the abdomen without contrast since patient also complains of epigastric discomfort which will also rule out any renal obstruction. HIV status and acute hepatitis panel and lipase are pending. Patient will need nephrology consult in a.m. Patient may need Lasix. 3. Possible CHF EF unknown with elevated troponin - may need Lasix will  need nephrology input. Check 2-D echo cycle cardiac markers patient is on aspirin for now. 4. Microcytic hypochromic anemia - check anemia panel follow CBC. Check stool studies. 5. Abnormal thyroid function test. Free T4 is mildly elevated but TSH is normal. 6. Diabetes mellitus type 2 - check hemoglobin A1c. For now I have Patient on sliding scale coverage. 7. Possible UTI - check urine culture patient is on ceftriaxone.  Urine pregnancy screen is pending. If negative I will be ordering CT scan of the abdomen without contrast and VQ scan.   DVT prophylaxis: SCDs for now until blood pressure gets better. Code Status: Full code.  Family Communication: Family at the bedside.  Disposition Plan: Home.  Consults called: None.  Admission status: Inpatient.    Rise Patience MD [facility] Pager [phone].  If 7PM-7AM, please contact night-coverage [email or web address] Password [facility]  10/27/2016, 5:48 AM

## 2016-10-27 NOTE — Progress Notes (Signed)
Admitting hospitalist at bedside at this time.

## 2016-10-27 NOTE — Progress Notes (Signed)
PROGRESS NOTE    Mallory Collier  DVV:[id number] DOB: October 19, 1970 DOA: 10/26/2016 PCP: Patient, No Pcp Per   Brief Narrative:  46 y.o. female PMHx HTN, Diabetes Type 2 uncontrolled, Noncompliance with Medication Regimen, (has not seen PCP in 2 years),   Presents to the ER with complaints of abdominal discomfort and elevated blood pressure. Patient states over the last 3 days patient has been having abdominal discomfort mostly in the epigastric area. Denies any nausea vomiting or diarrhea. Over the last 2 weeks patient also has been experiencing exertional shortness of breath. Denies any chest pain and productive cough fever or chills. For the last 3 days patient has been taking Aleve for the abdominal discomfort. Though patient has not followed up with her primary care she at times takes Diovan which she still has with her for her blood pressure when she feels that her blood pressure may be running high. After long patient checked her blood pressure yesterday and was found to be elevated.    ED Course: In the ER patient was found to have a systolic blood pressure more than 200 and labs revealed mildly elevated troponin with EKG showing sinus tachycardia and chest x-ray showing congestion. Patient has markedly elevated creatinine the last known creatinine in our system is in 2011 was 1.5. Creatinine today was around 5.9. In addition d-dimer was elevated. UA shows features concerning for UTI. Abdomen on exam his appearing benign. Patient was initially started on labetalol drip and changed to nitroglycerin infusion. At the time of my exam patient's blood pressure is around 180/110. Not in distress. Patient is being admitted for further management of hypertensive emergency with renal failure and possible CHF and elevated troponin and d-dimer.    Subjective: 9/8 A/O 4, negative CP, negative SOB, negative abdominal pain, negative N/V.    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 Counseled patient on process for beginning HD. -Will await nephrology to place Vas-Cath, most likely in a.m. for initial HD session.  -SPEP, FENa  pending -HIV, acute hepatitis panel pending -Renal ultrasound pending   CHF? -Echocardiogram pending  Microcytic hypochromic anemia  Abnormal thyroid function  Diabetes type 2 controlled with renal complications -9/8 Hemoglobin A1c= 6.3  UTI -Continue antibiotics 5 days -Urine culture pending  -Urine pregnancy screen is pending. If negative I will be ordering CT scan of the abdomen without contrast and VQ scan.  Goals of care -Patient asked that if family is around we not discuss her current medical situation    DVT prophylaxis: Subcutaneous heparin Code Status: Full Family Communication: None Disposition Plan: TBD   Consultants:  Nephrology  Procedures/Significant Events:  Echocardiogram pending Renal ultrasound pending    I have personally reviewed and interpreted all radiology studies and my findings are as above.  VENTILATOR SETTINGS:    Cultures 9/8 urine pending    Antimicrobials: Anti-infectives    Start     Stop   10/27/16 0600  cefTRIAXone (ROCEPHIN) 1 g in dextrose 5 % 50 mL IVPB             Devices  LINES / TUBES:      Continuous Infusions: . cefTRIAXone (ROCEPHIN)  IV Stopped (10/27/16 0630)     Objective: Vitals:   10/27/16 0230 10/27/16 0342 10/27/16 0613 10/27/16 0730  BP: (!) 160/101 (!) 176/118 (!) 163/97   Pulse: 72 80 77   Resp: 20 15    Temp:  98 F (36.7 C)  98.4 F (36.9 C)    TempSrc:  Oral  Oral  SpO2: 94% 97%    Weight:  158 lb 15.2 oz (72.1 kg)    Height:  5\' 5"  (1.651 m)      Intake/Output Summary (Last 24 hours) at 10/27/16 0847 Last data filed at 10/27/16 0800  Gross per 24 hour  Intake            84.63 ml  Output              200 ml  Net          -115.37 ml   Filed Weights   10/26/16 1944 10/27/16 0342  Weight: 165 lb (74.8 kg) 158 lb 15.2 oz (72.1 kg)    Examination:  General: A/O 4, No acute respiratory distress Neck:  Negative scars, masses, torticollis, lymphadenopathy, JVD Lungs: Clear to auscultation bilaterally without wheezes or crackles Cardiovascular: Regular rate and rhythm without murmur gallop or rub normal S1 and S2 Abdomen: negative abdominal pain, nondistended, positive soft, bowel sounds, no rebound, no ascites, no appreciable mass Extremities: No significant cyanosis, clubbing, or edema bilateral lower extremities Psychiatric:  Negative depression, negative anxiety, negative fatigue, negative mania, poor understanding of current health issues Central nervous system:  Cranial nerves II through XII intact, tongue/uvula midline, all extremities muscle strength 5/5, sensation intact throughout,  negative dysarthria, negative expressive aphasia, negative receptive aphasia.  .     Data Reviewed: Care during the described time interval was provided by me .  I have reviewed this patient's available data, including medical history, events of note, physical examination, and all test results as part of my evaluation.   CBC:  Recent Labs Lab 10/26/16 2026 10/27/16 0536  WBC 10.0 8.7  NEUTROABS 7.1 6.2  HGB 11.1* 9.5*  HCT 33.5* 30.6*  MCV 71.7* 73.7*  PLT 323 782   Basic Metabolic Panel:  Recent Labs Lab 10/26/16 2026 10/27/16 0536  NA 136 137  K 3.4* 3.7  CL 103 105  CO2 19* 17*  GLUCOSE 194* 155*  BUN 61* 61*  CREATININE 5.91* 6.20*  CALCIUM 9.8 9.7  PHOS  --  4.1   GFR: Estimated Creatinine Clearance:  11.3 mL/min (A) (by C-G formula based on SCr of 6.2 mg/dL (H)). Liver Function Tests:  Recent Labs Lab 10/26/16 2026 10/27/16 0536  AST 23 22  ALT 28 25  ALKPHOS 110 103  BILITOT 1.4* 1.5*  PROT 8.2* 7.4  ALBUMIN 4.5 3.8    Recent Labs Lab 10/26/16 2026 10/27/16 0536  LIPASE 44 46   No results for input(s): AMMONIA in the last 168 hours. Coagulation Profile: No results for input(s): INR, PROTIME in the last 168 hours. Cardiac Enzymes:  Recent Labs Lab 10/26/16 2026 10/27/16 0300 10/27/16 0536  CKTOTAL  --   --  72  TROPONINI 0.25* 0.24* 0.25*   BNP (last 3 results) No results for input(s): PROBNP in the last 8760 hours. HbA1C:  Recent Labs  10/27/16 0536  HGBA1C 6.3*   CBG:  Recent Labs Lab 10/26/16 1947 10/27/16 0751  GLUCAP 157* 148*   Lipid Profile: No  results for input(s): CHOL, HDL, LDLCALC, TRIG, CHOLHDL, LDLDIRECT in the last 72 hours. Thyroid Function Tests:  Recent Labs  10/26/16 2026  TSH 1.901  FREET4 1.39*   Anemia Panel:  Recent Labs  10/27/16 0536  VITAMINB12 1,409*  FOLATE 31.0  FERRITIN 21  TIBC 402  IRON 26*  RETICCTPCT 2.0   Urine analysis:    Component Value Date/Time   COLORURINE YELLOW 10/27/2016 0120   APPEARANCEUR CLOUDY (A) 10/27/2016 0120   LABSPEC 1.020 10/27/2016 0120   PHURINE 6.0 10/27/2016 0120   GLUCOSEU 250 (A) 10/27/2016 0120   HGBUR MODERATE (A) 10/27/2016 0120   BILIRUBINUR NEGATIVE 10/27/2016 0120   KETONESUR NEGATIVE 10/27/2016 0120   PROTEINUR >300 (A) 10/27/2016 0120   NITRITE NEGATIVE 10/27/2016 0120   LEUKOCYTESUR SMALL (A) 10/27/2016 0120   Sepsis Labs: @LABRCNTIP (procalcitonin:4,lacticidven:4)  ) Recent Results (from the past 240 hour(s))  MRSA PCR Screening     Status: None   Collection Time: 10/27/16  3:41 AM  Result Value Ref Range Status   MRSA by PCR NEGATIVE NEGATIVE Final    Comment:        The GeneXpert MRSA Assay (FDA approved for NASAL specimens only), is one  component of a comprehensive MRSA colonization surveillance program. It is not intended to diagnose MRSA infection nor to guide or monitor treatment for MRSA infections.          Radiology Studies: Dg Chest Port 1 View  Result Date: 10/26/2016 CLINICAL DATA:  46 year old female with history of acute onset of hypertension today. EXAM: PORTABLE CHEST 1 VIEW COMPARISON:  Chest x-ray a 02/07/2010. FINDINGS: There is mild cephalization of the pulmonary vasculature and slight indistinctness of the interstitial markings suggestive of mild pulmonary edema. No acute consolidative airspace disease. No definite pleural effusions. Mild cardiomegaly. The patient is rotated to the left on today's exam, resulting in distortion of the mediastinal contours and reduced diagnostic sensitivity and specificity for mediastinal pathology. IMPRESSION: 1. The appearance of the chest suggests very mild congestive heart failure, as above. Electronically Signed   By: Vinnie Langton M.D.   On: 10/26/2016 21:03        Scheduled Meds: . hydrALAZINE  25 mg Oral Q8H  . insulin aspart  0-9 Units Subcutaneous TID WC  . labetalol  100 mg Oral BID   Continuous Infusions: . cefTRIAXone (ROCEPHIN)  IV Stopped (10/27/16 0630)     LOS: 1 day    Time spent: 40 minutes    Geralda Baumgardner, Geraldo Docker, MD [facility] Pager [phone]   If 7PM-7AM, please contact night-coverage [email or web address] Password [facility] 10/27/2016, 8:47 AM

## 2016-10-27 NOTE — Progress Notes (Signed)
  Echocardiogram 2D Echocardiogram has been performed.  Mallory Collier 10/27/2016, 10:35 AM

## 2016-10-27 NOTE — Consult Note (Signed)
Referring Provider: No ref. provider found Primary Care Physician:  Patient, No Pcp Per Primary Nephrologist:  [place]  - Nephrologist Reason for Consultation:   Chronic renal failure  Anemia and secondary hyperparathyroidism   HPI:  This is a 46 year old lady that has diabetes and hypertension  She has not been to her PCP in over 2 years and has not been compliant with her antihypertensive medications   She presented to the ER with shortness of breath and abdominal pain  She was found to have a blood pressure of over 200 mmHg    Creatinine in 2011  1.5 Now is greater than 6.0   She is anemic and has a mild metabolic acidosis She reports taking Aleve now and again FH is positive for renal failure  She has been transitioned onto blood pressure medications  Past Medical History:  Diagnosis Date  . Diabetes mellitus without complication ([place])   . Hypertension   . Noncompliance with medication regimen     Past Surgical History:  Procedure Laterality Date  . CHOLECYSTECTOMY    . OVARIAN CYST SURGERY      Prior to Admission medications   Medication Sig Start Date End Date Taking? Authorizing Provider  Multiple Vitamin (MULTIVITAMIN WITH MINERALS) TABS tablet Take 1 tablet by mouth daily.   Yes [provider]  vitamin C (ASCORBIC ACID) 250 MG tablet Take 250 mg by mouth daily.   Yes [provider]    Current Facility-Administered Medications  Medication Dose Route Frequency Provider Last Rate Last Dose  . acetaminophen (TYLENOL) tablet 650 mg  650 mg Oral Q6H PRN Rise Patience, MD       Or  . acetaminophen (TYLENOL) suppository 650 mg  650 mg Rectal Q6H PRN Rise Patience, MD      . amLODipine (NORVASC) tablet 10 mg  10 mg Oral Daily Edrick Oh, MD      . cefTRIAXone (ROCEPHIN) 1 g in dextrose 5 % 50 mL IVPB  1 g Intravenous Q24H Rise Patience, MD   Stopped at 10/27/16 0630  . ferric gluconate (NULECIT) 125 mg in sodium chloride 0.9 % 100  mL IVPB  125 mg Intravenous Once Edrick Oh, MD      . hydrALAZINE (APRESOLINE) tablet 25 mg  25 mg Oral Q8H Rise Patience, MD   25 mg at 10/27/16 0421  . insulin aspart (novoLOG) injection 0-9 Units  0-9 Units Subcutaneous TID WC Rise Patience, MD   1 Units at 10/27/16 0800  . labetalol (NORMODYNE) tablet 200 mg  200 mg Oral BID Edrick Oh, MD        Allergies as of 10/26/2016  . (No Known Allergies)    Family History  Problem Relation Age of Onset  . Congestive Heart Failure Father     Social History   Social History  . Marital status: Married    Spouse name: N/A  . Number of children: N/A  . Years of education: N/A   Occupational History  . Not on file.   Social History Main Topics  . Smoking status: Never Smoker  . Smokeless tobacco: Never Used  . Alcohol use No  . Drug use: No  . Sexual activity: Not on file   Other Topics Concern  . Not on file   Social History Narrative  . No narrative on file    Review of Systems: Gen: Denies any fever, chills, sweats, anorexia, fatigue, weakness, malaise, weight loss, and  sleep disorder HEENT: No visual complaints, No history of Retinopathy. Normal external appearance No Epistaxis or Sore throat. No sinusitis.   CV: Denies chest pain, angina, palpitations, syncope, orthopnea, PND, peripheral edema, and claudication. Resp: Denies dyspnea at rest, dyspnea with exercise, cough, sputum, wheezing, coughing up blood, and pleurisy. GI: Denies vomiting blood, jaundice, and fecal incontinence.   Denies dysphagia or odynophagia. GU : Denies urinary burning, blood in urine, urinary frequency, urinary hesitancy, nocturnal urination, and urinary incontinence.  No renal calculi. MS: Denies joint pain, limitation of movement, and swelling, stiffness, low back pain, extremity pain. Denies muscle weakness, cramps, atrophy.  NSAIDS usage Derm: Denies rash, itching, dry skin, hives, moles, warts, or unhealing ulcers.  Psych:  Denies depression, anxiety, memory loss, suicidal ideation, hallucinations, paranoia, and confusion. Heme: Denies bruising, bleeding, and enlarged lymph nodes. Neuro: No headache.  No diplopia. No dysarthria.  No dysphasia.  No history of CVA.  No Seizures. No paresthesias.  No weakness. Endocrine   DM.  No Thyroid disease.  No Adrenal disease.  Physical Exam: Vital signs in last 24 hours: Temp:  [98 F (36.7 C)-98.4 F (36.9 C)] 98.4 F (36.9 C) (09/08 0730) Pulse Rate:  [70-117] 77 (09/08 0613) Resp:  [12-26] 15 (09/08 0342) BP: (156-236)/(96-153) 160/96 (09/08 0730) SpO2:  [94 %-100 %] 97 % (09/08 0342) Weight:  [158 lb 15.2 oz (72.1 kg)-165 lb (74.8 kg)] 158 lb 15.2 oz (72.1 kg) (09/08 0342) Last BM Date: 10/26/16 General:   Alert,  Well-developed, well-nourished, pleasant and cooperative in NAD Head:  Normocephalic and atraumatic. Eyes:  Sclera clear, no icterus.   Conjunctiva pink. Ears:  Normal auditory acuity. Nose:  No deformity, discharge,  or lesions. Mouth:  No deformity or lesions, dentition normal. Neck:  Supple; no masses or thyromegaly. JVP not elevated Lungs:  Clear throughout to auscultation.   No wheezes, crackles, or rhonchi. No acute distress. Heart:  Regular rate and rhythm; no murmurs, clicks, rubs,  or gallops. Abdomen:  Soft, nontender and nondistended. No masses, hepatosplenomegaly or hernias noted. Normal bowel sounds, without guarding, and without rebound.   Msk:  Symmetrical without gross deformities. Normal posture. Pulses:  No carotid, renal, femoral bruits. DP and PT symmetrical and equal Extremities:  Without clubbing or edema. Neurologic:  Alert and  oriented x4;  grossly normal neurologically. Skin:  Intact without significant lesions or rashes. Cervical Nodes:  No significant cervical adenopathy. Psych:  Alert and cooperative. Normal mood and affect.  Intake/Output from previous day: 09/07 0701 - 09/08 0700 In: 84.6 [I.V.:84.6] Out: -   Intake/Output this shift: Total I/O In: 240 [P.O.:240] Out: 200 [Urine:200]  Lab Results:  Recent Labs  10/26/16 2026 10/27/16 0536  WBC 10.0 8.7  HGB 11.1* 9.5*  HCT 33.5* 30.6*  PLT 323 260   BMET  Recent Labs  10/26/16 2026 10/27/16 0536  NA 136 137  K 3.4* 3.7  CL 103 105  CO2 19* 17*  GLUCOSE 194* 155*  BUN 61* 61*  CREATININE 5.91* 6.20*  CALCIUM 9.8 9.7  PHOS  --  4.1   LFT  Recent Labs  10/27/16 0536  PROT 7.4  ALBUMIN 3.8  AST 22  ALT 25  ALKPHOS 103  BILITOT 1.5*  BILIDIR 0.3  IBILI 1.2*   PT/INR No results for input(s): LABPROT, INR in the last 72 hours. Hepatitis Panel No results for input(s): HEPBSAG, HCVAB, HEPAIGM, HEPBIGM in the last 72 hours.  Studies/Results: Dg Chest Port 1 View  Result Date:  10/26/2016 CLINICAL DATA:  46 year old female with history of acute onset of hypertension today. EXAM: PORTABLE CHEST 1 VIEW COMPARISON:  Chest x-ray a 02/07/2010. FINDINGS: There is mild cephalization of the pulmonary vasculature and slight indistinctness of the interstitial markings suggestive of mild pulmonary edema. No acute consolidative airspace disease. No definite pleural effusions. Mild cardiomegaly. The patient is rotated to the left on today's exam, resulting in distortion of the mediastinal contours and reduced diagnostic sensitivity and specificity for mediastinal pathology. IMPRESSION: 1. The appearance of the chest suggests very mild congestive heart failure, as above. Electronically Signed   By: Vinnie Langton M.D.   On: 10/26/2016 21:03    Assessment/Plan:  CKD stage 5  It appears that she has fairly advanced renal failure and I suspect that this is due to uncontrolled hypertension  There may be an acute component although that will be difficult to predict as there is no other points except a creatinine of 1.5 in 2011. She does have a nephrologist in [facility] and has been told that the kidney function was stable about 1 year ago.  She had no other details. HIV is pending. Will check renal ultrasound and SPEP   Anemia  There appears to be iron deficiency anemia and this is mild  I shall give a dose of ferralicit  Bones  Will check PTH  HTN  Continue labetalol 200mg  BID and norvasc 10mg  daily  Unfortunately there may be no reversibility and we shall need to start preparing access    LOS: 1 Nazarene Bunning W @TODAY @11 :38 AM

## 2016-10-28 ENCOUNTER — Inpatient Hospital Stay (HOSPITAL_COMMUNITY): Payer: 59

## 2016-10-28 DIAGNOSIS — R748 Abnormal levels of other serum enzymes: Secondary | ICD-10-CM

## 2016-10-28 DIAGNOSIS — I5041 Acute combined systolic (congestive) and diastolic (congestive) heart failure: Secondary | ICD-10-CM

## 2016-10-28 DIAGNOSIS — N171 Acute kidney failure with acute cortical necrosis: Secondary | ICD-10-CM

## 2016-10-28 DIAGNOSIS — J810 Acute pulmonary edema: Secondary | ICD-10-CM

## 2016-10-28 DIAGNOSIS — I161 Hypertensive emergency: Principal | ICD-10-CM

## 2016-10-28 DIAGNOSIS — I5021 Acute systolic (congestive) heart failure: Secondary | ICD-10-CM

## 2016-10-28 LAB — BASIC METABOLIC PANEL
Anion gap: 9 (ref 5–15)
BUN: 52 mg/dL — AB (ref 6–20)
CHLORIDE: 108 mmol/L (ref 101–111)
CO2: 19 mmol/L — AB (ref 22–32)
Calcium: 8.9 mg/dL (ref 8.9–10.3)
Creatinine, Ser: 6.43 mg/dL — ABNORMAL HIGH (ref 0.44–1.00)
GFR calc Af Amer: 8 mL/min — ABNORMAL LOW (ref >60)
GFR calc non Af Amer: 7 mL/min — ABNORMAL LOW (ref >60)
GLUCOSE: 125 mg/dL — AB (ref 65–99)
POTASSIUM: 3.1 mmol/L — AB (ref 3.5–5.1)
Sodium: 136 mmol/L (ref 135–145)

## 2016-10-28 LAB — GLUCOSE, CAPILLARY
Glucose-Capillary: 136 mg/dL — ABNORMAL HIGH (ref 65–99)
Glucose-Capillary: 125 mg/dL — ABNORMAL HIGH (ref 65–99)

## 2016-10-28 LAB — MAGNESIUM
Magnesium: 2.3 mg/dL (ref 1.7–2.4)
Magnesium: 2.6 mg/dL — ABNORMAL HIGH (ref 1.7–2.4)

## 2016-10-28 LAB — PARATHYROID HORMONE, INTACT (NO CA): PTH: 243 pg/mL — ABNORMAL HIGH (ref 15–65)

## 2016-10-28 MED ORDER — METOPROLOL TARTRATE 50 MG PO TABS
100.0000 mg | ORAL_TABLET | Freq: Two times a day (BID) | ORAL | Status: DC
  Administered 2016-10-28 – 2016-10-30 (×5): 100 mg via ORAL
  Filled 2016-10-28 (×5): qty 2

## 2016-10-28 MED ORDER — LIVING WELL WITH DIABETES BOOK
Freq: Once | Status: AC
  Administered 2016-10-28: 22:00:00
  Filled 2016-10-28: qty 1

## 2016-10-28 MED ORDER — LIVING BETTER WITH HEART FAILURE BOOK
Freq: Once | Status: AC
  Administered 2016-10-29: 01:00:00

## 2016-10-28 MED ORDER — ISOSORBIDE MONONITRATE ER 30 MG PO TB24
30.0000 mg | ORAL_TABLET | Freq: Every day | ORAL | Status: DC
  Administered 2016-10-28 – 2016-10-30 (×3): 30 mg via ORAL
  Filled 2016-10-28 (×3): qty 1

## 2016-10-28 MED ORDER — LIVING BETTER WITH HEART FAILURE BOOK
Freq: Once | Status: AC
  Administered 2016-10-28: 20:00:00

## 2016-10-28 MED ORDER — VITAMIN C 500 MG PO TABS
250.0000 mg | ORAL_TABLET | Freq: Two times a day (BID) | ORAL | Status: DC
  Administered 2016-10-28 – 2016-10-30 (×5): 250 mg via ORAL
  Filled 2016-10-28 (×5): qty 1

## 2016-10-28 MED ORDER — KIDNEY FAILURE BOOK
Freq: Once | Status: AC
  Administered 2016-10-28: 22:00:00
  Filled 2016-10-28: qty 1

## 2016-10-28 MED ORDER — FERROUS SULFATE 325 (65 FE) MG PO TABS
325.0000 mg | ORAL_TABLET | Freq: Two times a day (BID) | ORAL | Status: DC
  Administered 2016-10-28 – 2016-10-30 (×5): 325 mg via ORAL
  Filled 2016-10-28 (×5): qty 1

## 2016-10-28 NOTE — Progress Notes (Signed)
[facility] ROUNDING NOTE   Subjective:   This is a 46 year old lady that has diabetes and hypertension  She has not been to her PCP in over 2 years and has not been compliant with her antihypertensive medications She presented to the ER with shortness of breath and abdominal pain  She was found to have a blood pressure of over 200 mmHg     Her renal function shows a creatinine of over 6 and GFR < 15  She is stage 5 Chronic renal disease although I see no reason to urgently place the patient on dialysis   Objective:  Vital signs in last 24 hours:  Temp:  [97.6 F (36.4 C)-99.4 F (37.4 C)] 98 F (36.7 C) (09/09 0800) Pulse Rate:  [66-90] 66 (09/09 0800) Resp:  [12-19] 14 (09/09 0800) BP: (137-185)/(70-114) 139/80 (09/09 0800) SpO2:  [98 %-100 %] 98 % (09/09 0800) Weight:  [162 lb 4.8 oz (73.6 kg)] 162 lb 4.8 oz (73.6 kg) (09/09 0648)  Weight change: -2 lb 11.2 oz (-1.225 kg) Filed Weights   10/26/16 1944 10/27/16 0342 10/28/16 0648  Weight: 165 lb (74.8 kg) 158 lb 15.2 oz (72.1 kg) 162 lb 4.8 oz (73.6 kg)    Intake/Output: I/O last 3 completed shifts: In: 1384.6 [P.O.:1200; I.V.:84.6; IV Piggyback:100] Out: 1200 [Urine:1200]   Intake/Output this shift:  Total I/O In: 360 [P.O.:360] Out: -   CVS- RRR RS- CTA ABD- BS present soft non-distended EXT- no edema   Basic Metabolic Panel:  Recent Labs Lab 10/26/16 2026 10/27/16 0536 10/28/16 0456 10/28/16 0843  NA 136 137  --  136  K 3.4* 3.7  --  3.1*  CL 103 105  --  108  CO2 19* 17*  --  19*  GLUCOSE 194* 155*  --  125*  BUN 61* 61*  --  52*  CREATININE 5.91* 6.20*  --  6.43*  CALCIUM 9.8 9.7  --  8.9  MG  --   --  2.3 2.6*  PHOS  --  4.1  --   --     Liver Function Tests:  Recent Labs Lab 10/26/16 2026 10/27/16 0536  AST 23 22  ALT 28 25  ALKPHOS 110 103  BILITOT 1.4* 1.5*  PROT 8.2* 7.4  ALBUMIN 4.5 3.8    Recent Labs Lab 10/26/16 2026 10/27/16 0536  LIPASE 44 46   No  results for input(s): AMMONIA in the last 168 hours.  CBC:  Recent Labs Lab 10/26/16 2026 10/27/16 0536  WBC 10.0 8.7  NEUTROABS 7.1 6.2  HGB 11.1* 9.5*  HCT 33.5* 30.6*  MCV 71.7* 73.7*  PLT 323 260    Cardiac Enzymes:  Recent Labs Lab 10/26/16 2026 10/27/16 0300 10/27/16 0536 10/27/16 1155 10/27/16 1753  CKTOTAL  --   --  72  --   --   TROPONINI 0.25* 0.24* 0.25* 0.34* 0.47*    BNP: Invalid input(s): POCBNP  CBG:  Recent Labs Lab 10/26/16 1947 10/27/16 0751 10/27/16 1140 10/27/16 1610 10/27/16 2126  GLUCAP 157* 148* 133* 17* 149*    Microbiology: Results for orders placed or performed during the hospital encounter of 10/26/16  MRSA PCR Screening     Status: None   Collection Time: 10/27/16  3:41 AM  Result Value Ref Range Status   MRSA by PCR NEGATIVE NEGATIVE Final    Comment:        The GeneXpert MRSA Assay (FDA approved for NASAL specimens only), is one  component of a comprehensive MRSA colonization surveillance program. It is not intended to diagnose MRSA infection nor to guide or monitor treatment for MRSA infections.     Coagulation Studies: No results for input(s): LABPROT, INR in the last 72 hours.  Urinalysis:  Recent Labs  10/27/16 0120  COLORURINE YELLOW  LABSPEC 1.020  PHURINE 6.0  GLUCOSEU 250*  HGBUR MODERATE*  BILIRUBINUR NEGATIVE  KETONESUR NEGATIVE  PROTEINUR >300*  NITRITE NEGATIVE  LEUKOCYTESUR SMALL*      Imaging: US Renal  Result Date: 10/27/2016 CLINICAL DATA:  Acute renal failure EXAM: RENAL / URINARY TRACT ULTRASOUND COMPLETE COMPARISON:  Report 03/13/2011 FINDINGS: Right Kidney: Length: 8.4 cm. Diffuse increased cortical echogenicity. Minimal splaying of the pelvis and calices. No focal parenchymal abnormality Left Kidney: Length: 10.9 cm. Increased cortical echogenicity. Mild hydronephrosis of probable extrarenal pelvis. No dilatation of the calices. Bladder: Bladder is empty IMPRESSION: 1. Increased  cortical echogenicity bilaterally consistent with medical renal disease 2. Minimal splaying of the right renal pelvis without frank hydronephrosis. Mild left hydronephrosis of probable extrarenal pelvis. 3. Empty bladder Electronically Signed   By: Donavan Foil M.D.   On: 10/27/2016 19:38   Dg Chest Port 1 View  Result Date: 10/26/2016 CLINICAL DATA:  46 year old female with history of acute onset of hypertension today. EXAM: PORTABLE CHEST 1 VIEW COMPARISON:  Chest x-ray a 02/07/2010. FINDINGS: There is mild cephalization of the pulmonary vasculature and slight indistinctness of the interstitial markings suggestive of mild pulmonary edema. No acute consolidative airspace disease. No definite pleural effusions. Mild cardiomegaly. The patient is rotated to the left on today's exam, resulting in distortion of the mediastinal contours and reduced diagnostic sensitivity and specificity for mediastinal pathology. IMPRESSION: 1. The appearance of the chest suggests very mild congestive heart failure, as above. Electronically Signed   By: Vinnie Langton M.D.   On: 10/26/2016 21:03     Medications:   . cefTRIAXone (ROCEPHIN)  IV Stopped (10/28/16 8657)   . amLODipine  10 mg Oral Daily  . heparin subcutaneous  5,000 Units Subcutaneous Q8H  . hydrALAZINE  25 mg Oral Q8H  . insulin aspart  0-9 Units Subcutaneous TID WC  . metoprolol tartrate  100 mg Oral BID   acetaminophen **OR** acetaminophen  Assessment/ Plan:   CKD stage 5  It appears that she has fairly advanced renal failure and I suspect that this is due to uncontrolled hypertension  There may be an acute component although that will be difficult to predict as there is no other points except a creatinine of 1.5 in 2011. She does have a nephrologist in [facility] and has been told that the kidney function was stable about 1 year ago. She had no other details. HIV is negative. Her renal ultrasound showed mild hydronephrosis on the Left and I think  a CT would be appropriate for better definition  Anemia  There appears to be iron deficiency anemia and this is mild  I shall give a dose of ferralicit  Bones   PTH pending   HTN  Continue labetalol 200mg  BID and norvasc 10mg  daily  Unfortunately there may be no reversibility and we shall need to start preparing access in the future  We shall continue to follow for now to assess the CT results     LOS: 2 Nasya Vincent W @TODAY @11 :08 AM

## 2016-10-28 NOTE — Progress Notes (Signed)
PROGRESS NOTE    Mallory Collier  EXH:[id number] DOB: [birth year] DOA: 10/26/2016 PCP: Patient, No Pcp Per   Brief Narrative:  46 y.o. BF PMHx HTN, Diabetes Type 2 uncontrolled, Noncompliance with Medication Regimen, (has not seen PCP in 2 years),   Presents to the ER with complaints of abdominal discomfort and elevated blood pressure. Patient states over the last 3 days patient has been having abdominal discomfort mostly in the epigastric area. Denies any nausea vomiting or diarrhea. Over the last 2 weeks patient also has been experiencing exertional shortness of breath. Denies any chest pain and productive cough fever or chills. For the last 3 days patient has been taking Aleve for the abdominal discomfort. Though patient has not followed up with her primary care she at times takes Diovan which she still has with her for her blood pressure when she feels that her blood pressure may be running high. After long patient checked her blood pressure yesterday and was found to be elevated.    ED Course: In the ER patient was found to have a systolic blood pressure more than 200 and labs revealed mildly elevated troponin with EKG showing sinus tachycardia and chest x-ray showing congestion. Patient has markedly elevated creatinine the last known creatinine in our system is in 2011 was 1.5. Creatinine today was around 5.9. In addition d-dimer was elevated. UA shows features concerning for UTI. Abdomen on exam his appearing benign. Patient was initially started on labetalol drip and changed to nitroglycerin infusion. At the time of my exam patient's blood pressure is around 180/110. Not in distress. Patient is being admitted for further management of hypertensive emergency with renal failure and possible CHF and elevated troponin and d-dimer.    Subjective: 9/9 A/O 4, negative CP, negative SOB, negative abdominal pain, negative N/V.      Assessment & Plan:   Principal Problem:   Hypertensive  emergency Active Problems:   Elevated troponin   AKI (acute kidney injury) ([place])   CHF (congestive heart failure) (HCC)   Type 2 diabetes mellitus with renal manifestations (HCC)   Microcytic hypochromic anemia  Acute Systolic and Diastolic CHF -Echocardiogram: Consistent with acute systolic and diastolic CHF see results below -DC labetalol although A/B blocker no studies to show beneficial in heart failure, therefore will switch to Metoprolol -9/9 start Metoprolol 100 mg BID -Amlodipine 10 mg daily -Hydralazine 25 mg TID -consulted cardiology. 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 Counseled patient on process for beginning HD. -SPEP, UPEP pending -Acute hepatitis panel pending -HIV negative: -Renal ultrasound: Shows medical renal disease see results below -Spoke with Dr. Edrick Oh nephrology does not believe patient requires placement of Vas-Cath at this time. Schedule follow-up in 1-2 weeks with Dr. Edrick Oh acute on CKD stage IV -Obtain CT abdomen pelvis W/O contrast per nephrology recommendation:Mild left hydronephrosis of probable extrarenal pelvis. Mass?   Microcytic hypochromic anemia -Anemia panel consistent with iron deficiency anemia, Nephrology administered a dose of Ferralicit -Start iron FeSO4 325 mg BID + vitamin C 250mg  BID  Abnormal thyroid function -TSH WNL, Free T4 slightly elevated. Would not start any medication during patient's acute illness. -PCP to repeat TSH in 4-6 weeks  Diabetes type 2 controlled with renal complications -9/8 Hemoglobin A1c= 6.3  UTI -Continue antibiotics 5 days -Urine culture pending   Goals of care -Patient asked that if family is  around we not discuss her current medical situation -9/9 consult to LCSW patient will require PCP.    DVT prophylaxis: Subcutaneous heparin Code Status: Full Family  Communication: None Disposition Plan: TBD   Consultants:  Nephrology   Procedures/Significant Events:  9/8 Echocardiogram: LEFT ventricle: Moderate LVH.-LVEF= 20-25%, diffuse hypokinesis -Grade 2 diastolic dysfunction,mid anteroseptal and mid inferoseptal myocardium; severe hypokinesis   of the mid anterior and mid inferior myocardium. -LEFT atrium: Severely dilated. 9/8 Renal Ultrasound:-Increase cortical echogenicity bilaterally C/W medical renal disease -Minimal splaying right renal pelvis without frank hydronephrosis. Mild left hydronephrosis of probable extrarenal pelvis.       I have personally reviewed and interpreted all radiology studies and my findings are as above.  VENTILATOR SETTINGS:    Cultures 9/8 HIV negative 9/9 urine pending    Antimicrobials: Anti-infectives    Start     Stop   10/27/16 0600  cefTRIAXone (ROCEPHIN) 1 g in dextrose 5 % 50 mL IVPB             Devices    LINES / TUBES:      Continuous Infusions: . cefTRIAXone (ROCEPHIN)  IV Stopped (10/28/16 1696)     Objective: Vitals:   10/28/16 0000 10/28/16 0400 10/28/16 0648 10/28/16 0800  BP: 137/70 138/80  139/80  Pulse: 71 69  66  Resp: 12 16  14   Temp: 98.8 F (37.1 C) 98.5 F (36.9 C)  98 F (36.7 C)  TempSrc: Oral Oral    SpO2: 98% 99%  98%  Weight:   162 lb 4.8 oz (73.6 kg)   Height:        Intake/Output Summary (Last 24 hours) at 10/28/16 0824 Last data filed at 10/28/16 7893  Gross per 24 hour  Intake             1060 ml  Output             1000 ml  Net               60 ml   Filed Weights   10/26/16 1944 10/27/16 0342 10/28/16 0648  Weight: 165 lb (74.8 kg) 158 lb 15.2 oz (72.1 kg) 162 lb 4.8 oz (73.6 kg)   Physical Exam:  General: A/O 4, No acute respiratory distress Neck:  Negative scars, masses, torticollis, lymphadenopathy, JVD Lungs: Clear to auscultation bilaterally without wheezes or crackles Cardiovascular: Regular rate and rhythm without  murmur gallop or rub normal S1 and S2 Abdomen: negative abdominal pain, nondistended, positive soft, bowel sounds, no rebound, no ascites, no appreciable mass Extremities: No significant cyanosis, clubbing, or edema bilateral lower extremities Skin: Negative rashes, lesions, ulcers Psychiatric:  Negative depression, negative anxiety, negative fatigue, negative mania  Central nervous system:  Cranial nerves II through XII intact, tongue/uvula midline, all extremities muscle strength 5/5, sensation intact throughout,  negative dysarthria, negative expressive aphasia, negative receptive aphasia.    Data Reviewed: Care during the described time interval was provided by me .  I have reviewed this patient's available data, including medical history, events of note, physical examination, and all test results as part of my evaluation.   CBC:  Recent Labs Lab 10/26/16 2026 10/27/16 0536  WBC 10.0 8.7  NEUTROABS 7.1 6.2  HGB 11.1* 9.5*  HCT 33.5* 30.6*  MCV 71.7* 73.7*  PLT 323 810   Basic Metabolic Panel:  Recent Labs Lab 10/26/16 2026 10/27/16 0536 10/28/16 0456  NA 136 137  --   K 3.4* 3.7  --  CL 103 105  --   CO2 19* 17*  --   GLUCOSE 194* 155*  --   BUN 61* 61*  --   CREATININE 5.91* 6.20*  --   CALCIUM 9.8 9.7  --   MG  --   --  2.3  PHOS  --  4.1  --    GFR: Estimated Creatinine Clearance: 11.4 mL/min (A) (by C-G formula based on SCr of 6.2 mg/dL (H)). Liver Function Tests:  Recent Labs Lab 10/26/16 2026 10/27/16 0536  AST 23 22  ALT 28 25  ALKPHOS 110 103  BILITOT 1.4* 1.5*  PROT 8.2* 7.4  ALBUMIN 4.5 3.8    Recent Labs Lab 10/26/16 2026 10/27/16 0536  LIPASE 44 46   No results for input(s): AMMONIA in the last 168 hours. Coagulation Profile: No results for input(s): INR, PROTIME in the last 168 hours. Cardiac Enzymes:  Recent Labs Lab 10/26/16 2026 10/27/16 0300 10/27/16 0536 10/27/16 1155 10/27/16 1753  CKTOTAL  --   --  72  --   --     TROPONINI 0.25* 0.24* 0.25* 0.34* 0.47*   BNP (last 3 results) No results for input(s): PROBNP in the last 8760 hours. HbA1C:  Recent Labs  10/27/16 0536  HGBA1C 6.3*   CBG:  Recent Labs Lab 10/26/16 1947 10/27/16 0751 10/27/16 1140 10/27/16 1610 10/27/16 2126  GLUCAP 157* 148* 133* 137* 149*   Lipid Profile: No results for input(s): CHOL, HDL, LDLCALC, TRIG, CHOLHDL, LDLDIRECT in the last 72 hours. Thyroid Function Tests:  Recent Labs  10/26/16 2026  TSH 1.901  FREET4 1.39*   Anemia Panel:  Recent Labs  10/27/16 0536  VITAMINB12 1,409*  FOLATE 31.0  FERRITIN 21  TIBC 402  IRON 26*  RETICCTPCT 2.0   Urine analysis:    Component Value Date/Time   COLORURINE YELLOW 10/27/2016 0120   APPEARANCEUR CLOUDY (A) 10/27/2016 0120   LABSPEC 1.020 10/27/2016 0120   PHURINE 6.0 10/27/2016 0120   GLUCOSEU 250 (A) 10/27/2016 0120   HGBUR MODERATE (A) 10/27/2016 0120   BILIRUBINUR NEGATIVE 10/27/2016 0120   KETONESUR NEGATIVE 10/27/2016 0120   PROTEINUR >300 (A) 10/27/2016 0120   NITRITE NEGATIVE 10/27/2016 0120   LEUKOCYTESUR SMALL (A) 10/27/2016 0120   Sepsis Labs: @LABRCNTIP (procalcitonin:4,lacticidven:4)  ) Recent Results (from the past 240 hour(s))  MRSA PCR Screening     Status: None   Collection Time: 10/27/16  3:41 AM  Result Value Ref Range Status   MRSA by PCR NEGATIVE NEGATIVE Final    Comment:        The GeneXpert MRSA Assay (FDA approved for NASAL specimens only), is one component of a comprehensive MRSA colonization surveillance program. It is not intended to diagnose MRSA infection nor to guide or monitor treatment for MRSA infections.          Radiology Studies: US Renal  Result Date: 10/27/2016 CLINICAL DATA:  Acute renal failure EXAM: RENAL / URINARY TRACT ULTRASOUND COMPLETE COMPARISON:  Report 03/13/2011 FINDINGS: Right Kidney: Length: 8.4 cm. Diffuse increased cortical echogenicity. Minimal splaying of the pelvis and  calices. No focal parenchymal abnormality Left Kidney: Length: 10.9 cm. Increased cortical echogenicity. Mild hydronephrosis of probable extrarenal pelvis. No dilatation of the calices. Bladder: Bladder is empty IMPRESSION: 1. Increased cortical echogenicity bilaterally consistent with medical renal disease 2. Minimal splaying of the right renal pelvis without frank hydronephrosis. Mild left hydronephrosis of probable extrarenal pelvis. 3. Empty bladder Electronically Signed   By: Madie Reno.D.  On: 10/27/2016 19:38   Dg Chest Port 1 View  Result Date: 10/26/2016 CLINICAL DATA:  46 year old female with history of acute onset of hypertension today. EXAM: PORTABLE CHEST 1 VIEW COMPARISON:  Chest x-ray a 02/07/2010. FINDINGS: There is mild cephalization of the pulmonary vasculature and slight indistinctness of the interstitial markings suggestive of mild pulmonary edema. No acute consolidative airspace disease. No definite pleural effusions. Mild cardiomegaly. The patient is rotated to the left on today's exam, resulting in distortion of the mediastinal contours and reduced diagnostic sensitivity and specificity for mediastinal pathology. IMPRESSION: 1. The appearance of the chest suggests very mild congestive heart failure, as above. Electronically Signed   By: Vinnie Langton M.D.   On: 10/26/2016 21:03        Scheduled Meds: . amLODipine  10 mg Oral Daily  . heparin subcutaneous  5,000 Units Subcutaneous Q8H  . hydrALAZINE  25 mg Oral Q8H  . insulin aspart  0-9 Units Subcutaneous TID WC  . labetalol  200 mg Oral BID   Continuous Infusions: . cefTRIAXone (ROCEPHIN)  IV Stopped (10/28/16 0702)     LOS: 2 days    Time spent: 40 minutes    Laniah Grimm, Geraldo Docker, MD [facility] Pager [phone]   If 7PM-7AM, please contact night-coverage [email or web address] Password [email or web address] 10/28/2016, 8:24 AM

## 2016-10-28 NOTE — Consult Note (Signed)
CARDIOLOGY CONSULT NOTE     Primary Care Physician: Patient, No Pcp Per Referring Physician:  new  Admit Date: 10/26/2016  Reason for consultation:  CHF  Mallory Collier is a 46 y.o. female with a h/o HTN, DM, and medical nonadherence who is admitted with hypertensive  Emergency with renal failure and CHF.  Cardiology is consulted by Dr Sherral Hammers for management of CHF.  She reports that she was told in 2011 that she had "heart problems" and was placed on medicine for CHF.  She has not been compliant with medical therapy.   Over the past few weeks, she has noticed increasing abdominal distension with worsening SOB.  She is admitted and found to have advanced renal failure as well as biventricular cardiac dysfunction. She has been admitted to the medicine service for further evaluation.  Nephrology has been consulted for management of her advanced renal failure.  Today, she denies symptoms of palpitations, chest pain, shortness of breath, orthopnea, PND, lower extremity edema, dizziness, presyncope, syncope, or neurologic sequela. The patient is tolerating medications without difficulties and is otherwise without complaint today.   Past Medical History:  Diagnosis Date  . Diabetes mellitus without complication ([place])   . Hypertension   . Noncompliance with medication regimen    Past Surgical History:  Procedure Laterality Date  . CHOLECYSTECTOMY    . OVARIAN CYST SURGERY      . amLODipine  10 mg Oral Daily  . ferrous sulfate  325 mg Oral BID WC  . heparin subcutaneous  5,000 Units Subcutaneous Q8H  . hydrALAZINE  25 mg Oral Q8H  . insulin aspart  0-9 Units Subcutaneous TID WC  . metoprolol tartrate  100 mg Oral BID  . vitamin C  250 mg Oral BID   . cefTRIAXone (ROCEPHIN)  IV Stopped (10/28/16 4742)    No Known Allergies  Social History   Social History  . Marital status: Married    Spouse name: N/A  . Number of children: N/A  . Years of education: N/A   Occupational History    . Not on file.   Social History Main Topics  . Smoking status: Never Smoker  . Smokeless tobacco: Never Used  . Alcohol use No  . Drug use: No  . Sexual activity: Not on file   Other Topics Concern  . Not on file   Social History Narrative  . No narrative on file  lives in [place].  Works as an Optometrist  Family History  Problem Relation Age of Onset  . Congestive Heart Failure Father   + HTN  ROS- All systems are reviewed and negative except as per the HPI above  Physical Exam: Telemetry:  Sinus rhythm with nonsustained atrial and ventricular ectopy Vitals:   10/28/16 0000 10/28/16 0400 10/28/16 0648 10/28/16 0800  BP: 137/70 138/80  139/80  Pulse: 71 69  66  Resp: 12 16  14   Temp: 98.8 F (37.1 C) 98.5 F (36.9 C)  98 F (36.7 C)  TempSrc: Oral Oral    SpO2: 98% 99%  98%  Weight:   162 lb 4.8 oz (73.6 kg)   Height:        GEN- The patient is well appearing, alert and oriented x 3 today.   Head- normocephalic, atraumatic Eyes-  Sclera clear, conjunctiva pink Ears- hearing intact Oropharynx- clear Neck- supple, + JVD Lungs- decreased BS at bases, normal work of breathing Heart- Regular rate and rhythm, no murmurs, rubs or gallops, PMI not  laterally displaced GI- soft, NT, ND, + BS Extremities- no clubbing, cyanosis, or edema MS- no significant deformity or atrophy Skin- no rash or lesion Psych- euthymic mood, full affect Neuro- strength and sensation are intact  EKG:  Sinus rhythm with LVH, repolarization abnormality  Labs:   Lab Results  Component Value Date   WBC 8.7 10/27/2016   HGB 9.5 (L) 10/27/2016   HCT 30.6 (L) 10/27/2016   MCV 73.7 (L) 10/27/2016   PLT 260 10/27/2016    Recent Labs Lab 10/27/16 0536 10/28/16 0843  NA 137 136  K 3.7 3.1*  CL 105 108  CO2 17* 19*  BUN 61* 52*  CREATININE 6.20* 6.43*  CALCIUM 9.7 8.9  PROT 7.4  --   BILITOT 1.5*  --   ALKPHOS 103  --   ALT 25  --   AST 22  --   GLUCOSE 155* 125*   Lab  Results  Component Value Date   CKTOTAL 72 10/27/2016   TROPONINI 0.47 (HH) 10/27/2016   No results found for: CHOL No results found for: HDL No results found for: LDLCALC No results found for: TRIG No results found for: CHOLHDL No results found for: LDLDIRECT    Radiology: CXR is personally reviewed and reveals mild vascular congestion/ edema,  Normal heart size.  Renal US is also reviewed and cortical (medical renal disease) is noted  Echo: reviewed and reveals severe biventricular failure (EF 20%) with moderate LVH, mild MR, severe LA enlargement  Dr Sherral Hammers and Dr Beverly Gust notes are also reviewed  ASSESSMENT AND PLAN:   1. Hypertensive emergency with CHF and renal failure The patient presents with markedly elevated BP, now improved.  Medical non adherence is likely the exacerbating factor.  Continue current medical therapy  2. Acute systolic dysfunction Biventricular failure Only mildly volume overloaded on exam Diurese as tolerated though we are limited with renal function On metoprolol and hydralazine Will add imdur for afterload reduction 2 gram sodium diet Strict Is and Os Likely due to hypertensive disease.  Not currently a candidate for cath/ invasive procedures.  Will workup etiology of her heart failure further as she recovers.  3. Elevated troponin Demand ischemia  Not consistent with acute thrombotic MI  4. Acute renal failure Appreciated nephrology input Ultrasound reveals cortical renal disease   This patient is quite ill.  She has multiple organ failure in the setting of hypertensive emergency.  I worry that she is at risk for decompensation, worsening CHF.  A high level of decision making was required for this encounter.   Thompson Grayer, MD 10/28/2016  12:47 PM

## 2016-10-29 ENCOUNTER — Inpatient Hospital Stay (HOSPITAL_COMMUNITY): Payer: 59

## 2016-10-29 DIAGNOSIS — I120 Hypertensive chronic kidney disease with stage 5 chronic kidney disease or end stage renal disease: Secondary | ICD-10-CM

## 2016-10-29 DIAGNOSIS — I5043 Acute on chronic combined systolic (congestive) and diastolic (congestive) heart failure: Secondary | ICD-10-CM

## 2016-10-29 DIAGNOSIS — N179 Acute kidney failure, unspecified: Secondary | ICD-10-CM

## 2016-10-29 DIAGNOSIS — N185 Chronic kidney disease, stage 5: Secondary | ICD-10-CM

## 2016-10-29 DIAGNOSIS — N186 End stage renal disease: Secondary | ICD-10-CM

## 2016-10-29 LAB — GLUCOSE, CAPILLARY
GLUCOSE-CAPILLARY: 125 mg/dL — AB (ref 65–99)
GLUCOSE-CAPILLARY: 89 mg/dL (ref 65–99)
Glucose-Capillary: 88 mg/dL (ref 65–99)
Glucose-Capillary: 106 mg/dL — ABNORMAL HIGH (ref 65–99)

## 2016-10-29 LAB — COMPREHENSIVE METABOLIC PANEL
ALBUMIN: 3 g/dL — AB (ref 3.5–5.0)
ALT: 18 U/L (ref 14–54)
ANION GAP: 10 (ref 5–15)
AST: 15 U/L (ref 15–41)
Alkaline Phosphatase: 90 U/L (ref 38–126)
BUN: 58 mg/dL — ABNORMAL HIGH (ref 6–20)
CALCIUM: 8.7 mg/dL — AB (ref 8.9–10.3)
CO2: 18 mmol/L — AB (ref 22–32)
CREATININE: 6.49 mg/dL — AB (ref 0.44–1.00)
Chloride: 109 mmol/L (ref 101–111)
GFR calc non Af Amer: 7 mL/min — ABNORMAL LOW (ref >60)
GFR, EST AFRICAN AMERICAN: 8 mL/min — AB (ref >60)
Glucose, Bld: 109 mg/dL — ABNORMAL HIGH (ref 65–99)
Potassium: 3.3 mmol/L — ABNORMAL LOW (ref 3.5–5.1)
SODIUM: 137 mmol/L (ref 135–145)
TOTAL PROTEIN: 6 g/dL — AB (ref 6.5–8.1)
Total Bilirubin: 0.5 mg/dL (ref 0.3–1.2)

## 2016-10-29 LAB — PROTEIN ELECTROPHORESIS, SERUM
A/G RATIO SPE: 1.1 (ref 0.7–1.7)
A/G Ratio: 1.1 (ref 0.7–1.7)
ALBUMIN ELP: 3.6 g/dL (ref 2.9–4.4)
ALPHA-2-GLOBULIN: 0.7 g/dL (ref 0.4–1.0)
Albumin ELP: 3.9 g/dL (ref 2.9–4.4)
Alpha-1-Globulin: 0.3 g/dL (ref 0.0–0.4)
Alpha-1-Globulin: 0.3 g/dL (ref 0.0–0.4)
Alpha-2-Globulin: 0.6 g/dL (ref 0.4–1.0)
BETA GLOBULIN: 1.2 g/dL (ref 0.7–1.3)
Beta Globulin: 1.1 g/dL (ref 0.7–1.3)
Gamma Globulin: 1.3 g/dL (ref 0.4–1.8)
Gamma Globulin: 1.4 g/dL (ref 0.4–1.8)
Globulin, Total: 3.4 g/dL (ref 2.2–3.9)
Globulin, Total: 3.6 g/dL (ref 2.2–3.9)
TOTAL PROTEIN ELP: 7 g/dL (ref 6.0–8.5)
Total Protein ELP: 7.5 g/dL (ref 6.0–8.5)

## 2016-10-29 LAB — URINE CULTURE: CULTURE: NO GROWTH

## 2016-10-29 LAB — HEPATITIS PANEL, ACUTE
HCV Ab: <0.1 s/co ratio (ref 0.0–0.9)
HEP A IGM: NEGATIVE
HEP B C IGM: NEGATIVE
HEP B S AG: NEGATIVE

## 2016-10-29 MED ORDER — POTASSIUM CHLORIDE ER 10 MEQ PO TBCR
20.0000 meq | EXTENDED_RELEASE_TABLET | Freq: Every day | ORAL | Status: DC
  Administered 2016-10-29 – 2016-10-30 (×2): 20 meq via ORAL
  Filled 2016-10-29 (×4): qty 2

## 2016-10-29 MED ORDER — SPIRONOLACTONE 25 MG PO TABS
25.0000 mg | ORAL_TABLET | Freq: Every day | ORAL | Status: DC
  Filled 2016-10-29: qty 1

## 2016-10-29 MED ORDER — HYDRALAZINE HCL 50 MG PO TABS
50.0000 mg | ORAL_TABLET | Freq: Three times a day (TID) | ORAL | Status: DC
  Administered 2016-10-29 – 2016-10-30 (×3): 50 mg via ORAL
  Filled 2016-10-29 (×3): qty 1

## 2016-10-29 NOTE — Progress Notes (Signed)
[facility] ROUNDING NOTE   Subjective:   This is a 46 year old lady that has diabetes and hypertension  She has not been to her PCP in over 2 years and has not been compliant with her antihypertensive medications She presented to the ER with shortness of breath and abdominal pain  She was found to have a blood pressure of over 200 mmHg     Her renal function shows a creatinine of over 6 and GFR < 15.    Without uremic symptoms today.  Feeling well.  Lots of questions about dialysis.  Wishes to transfer care to [place].    Objective:  Vital signs in last 24 hours:  Temp:  [97.7 F (36.5 C)-98.7 F (37.1 C)] 98.6 F (37 C) (09/10 0433) Pulse Rate:  [65-76] 75 (09/10 0744) Resp:  [13-19] 18 (09/10 0744) BP: (128-155)/(69-98) 155/98 (09/10 0744) SpO2:  [99 %-100 %] 100 % (09/10 0744) Weight:  [76.9 kg (169 lb 8.5 oz)] 76.9 kg (169 lb 8.5 oz) (09/10 0433)  Weight change: 3.281 kg (7 lb 3.7 oz) Filed Weights   10/27/16 0342 10/28/16 0648 10/29/16 0433  Weight: 72.1 kg (158 lb 15.2 oz) 73.6 kg (162 lb 4.8 oz) 76.9 kg (169 lb 8.5 oz)    Intake/Output: I/O last 3 completed shifts: In: 1900 [P.O.:1800; IV Piggyback:100] Out: 1450 [Urine:1450]   Intake/Output this shift:  No intake/output data recorded. GEN: awake and alert, eating breakfast CVS- RRR RS- CTA ABD- BS present soft non-distended EXT- no edema   Basic Metabolic Panel:  Recent Labs Lab 10/26/16 2026 10/27/16 0536 10/28/16 0456 10/28/16 0843 10/29/16 0322  NA 136 137  --  136 137  K 3.4* 3.7  --  3.1* 3.3*  CL 103 105  --  108 109  CO2 19* 17*  --  19* 18*  GLUCOSE 194* 155*  --  125* 109*  BUN 61* 61*  --  52* 58*  CREATININE 5.91* 6.20*  --  6.43* 6.49*  CALCIUM 9.8 9.7  --  8.9 8.7*  MG  --   --  2.3 2.6*  --   PHOS  --  4.1  --   --   --     Liver Function Tests:  Recent Labs Lab 10/26/16 2026 10/27/16 0536 10/29/16 0322  AST 23 22 15   ALT 28 25 18   ALKPHOS 110 103 90  BILITOT  1.4* 1.5* 0.5  PROT 8.2* 7.4 6.0*  ALBUMIN 4.5 3.8 3.0*    Recent Labs Lab 10/26/16 2026 10/27/16 0536  LIPASE 44 46   No results for input(s): AMMONIA in the last 168 hours.  CBC:  Recent Labs Lab 10/26/16 2026 10/27/16 0536  WBC 10.0 8.7  NEUTROABS 7.1 6.2  HGB 11.1* 9.5*  HCT 33.5* 30.6*  MCV 71.7* 73.7*  PLT 323 260    Cardiac Enzymes:  Recent Labs Lab 10/26/16 2026 10/27/16 0300 10/27/16 0536 10/27/16 1155 10/27/16 1753  CKTOTAL  --   --  72  --   --   TROPONINI 0.25* 0.24* 0.25* 0.34* 0.47*    BNP: Invalid input(s): POCBNP  CBG:  Recent Labs Lab 10/27/16 1610 10/27/16 2126 10/28/16 1601 10/28/16 2059 10/29/16 0803  GLUCAP 137* 149* 136* 125* 71    Microbiology: Results for orders placed or performed during the hospital encounter of 10/26/16  MRSA PCR Screening     Status: None   Collection Time: 10/27/16  3:41 AM  Result Value Ref Range Status  MRSA by PCR NEGATIVE NEGATIVE Final    Comment:        The GeneXpert MRSA Assay (FDA approved for NASAL specimens only), is one component of a comprehensive MRSA colonization surveillance program. It is not intended to diagnose MRSA infection nor to guide or monitor treatment for MRSA infections.   Culture, Urine     Status: None   Collection Time: 10/28/16  2:59 AM  Result Value Ref Range Status   Specimen Description URINE, CATHETERIZED  Final   Special Requests NONE  Final   Culture NO GROWTH  Final   Report Status 10/29/2016 FINAL  Final    Coagulation Studies: No results for input(s): LABPROT, INR in the last 72 hours.  Urinalysis:  Recent Labs  10/27/16 0120  COLORURINE YELLOW  LABSPEC 1.020  PHURINE 6.0  GLUCOSEU 250*  HGBUR MODERATE*  BILIRUBINUR NEGATIVE  KETONESUR NEGATIVE  PROTEINUR >300*  NITRITE NEGATIVE  LEUKOCYTESUR SMALL*      Imaging: Ct Abdomen Pelvis Wo Contrast  Result Date: 10/28/2016 CLINICAL DATA:  46 year old female with multiple organ failure  in the setting of hypertensive emergency an acute renal failure. EXAM: CT ABDOMEN AND PELVIS WITHOUT CONTRAST TECHNIQUE: Multidetector CT imaging of the abdomen and pelvis was performed following the standard protocol without IV contrast. COMPARISON:  None. FINDINGS: Lower chest: The visualized lower lungs are clear. Marked cardiomegaly with left ventricular dilatation. No pericardial effusion. Unremarkable visualized thoracic esophagus. Hepatobiliary: No focal liver abnormality is seen. Status post cholecystectomy. No biliary dilatation. Pancreas: Unremarkable. No pancreatic ductal dilatation or surrounding inflammatory changes. Spleen: Normal in size without focal abnormality. Adrenals/Urinary Tract: Unremarkable adrenal glands. The kidneys are relatively small bilaterally. Left extrarenal pelvis with slight dilatation. 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 No nephrolithiasis. The bladder is decompressed. Stomach/Bowel: No evidence of obstruction or focal bowel wall thickening. Normal appendix in the right lower quadrant. The terminal ileum is unremarkable. Vascular/Lymphatic: Limited evaluation in the absence of intravenous contrast. No aneurysm. Mild atherosclerotic vascular calcifications along the abdominal aorta. No suspicious lymphadenopathy. Reproductive: Uterus and bilateral adnexa are unremarkable. Other: Trace free fluid in the pelvic cul-de-sac may be physiologic. Musculoskeletal: No acute fracture or aggressive appearing lytic or blastic osseous lesion. L4-L5 and L5-S1 facet arthropathy on the right. IMPRESSION: 1. No acute abnormality in the abdomen or pelvis. 2. Marked cardiomegaly with left ventricular dilatation. 3. Surgical changes of prior cholecystectomy. 4. Dilated left extrarenal pelvis. 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 5.  Aortic Atherosclerosis (ICD10-170.0) 6. Right lower lumbar facet arthropathy. Electronically Signed   By: Jacqulynn Cadet M.D.   On: 10/28/2016 14:41    US Renal  Result Date: 10/27/2016 CLINICAL DATA:  Acute renal failure EXAM: RENAL / URINARY TRACT ULTRASOUND COMPLETE COMPARISON:  Report 03/13/2011 FINDINGS: Right Kidney: Length: 8.4 cm. Diffuse increased cortical echogenicity. Minimal splaying of the pelvis and calices. No focal parenchymal abnormality Left Kidney: Length: 10.9 cm. Increased cortical echogenicity. Mild hydronephrosis of probable extrarenal pelvis. No dilatation of the calices. Bladder: Bladder is empty IMPRESSION: 1. Increased cortical echogenicity bilaterally consistent with medical renal disease 2. Minimal splaying of the right renal pelvis without frank hydronephrosis. Mild left hydronephrosis of probable extrarenal pelvis. 3. Empty bladder Electronically Signed   By: Donavan Foil M.D.   On: 10/27/2016 19:38     Medications:   . cefTRIAXone (ROCEPHIN)  IV 1 g (10/29/16 0557)   . amLODipine  10 mg Oral Daily  . ferrous sulfate  325 mg Oral BID WC  .  heparin subcutaneous  5,000 Units Subcutaneous Q8H  . hydrALAZINE  25 mg Oral Q8H  . insulin aspart  0-9 Units Subcutaneous TID WC  . isosorbide mononitrate  30 mg Oral Daily  . metoprolol tartrate  100 mg Oral BID  . vitamin C  250 mg Oral BID   acetaminophen **OR** acetaminophen  Assessment/ Plan:   CKD stage 5  It appears that she has fairly advanced renal failure and I suspect that this is due to uncontrolled hypertension.  Last known Cr of 1.5 in 2011. She does have a nephrologist in [facility] and has been told that the kidney function was stable about 1 year ago. She had no other details. HIV is negative. Her renal ultrasound showed mild hydronephrosis on the Left and diffuse echogenicity.  I don't suspect there is much reversibility here.  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  I have ordered vein mapping for today.  Discussed that she will need a fistula in the very near future.  Thinking about  access.  Anemia  There appears to be iron deficiency anemia and this is mild  I shall give a dose of ferralicit  Bones   PTH pending   HTN  Continue labetalol 200mg  BID and norvasc 10mg  daily      LOS: 3 Calyse Murcia @TODAY @9 :16 AM

## 2016-10-29 NOTE — Progress Notes (Addendum)
Progress Note  Patient Name: Mallory Collier Date of Encounter: 10/29/2016  Primary Cardiologist: New patient  Subjective   She feels better today, started to walk.  Inpatient Medications    Scheduled Meds: . amLODipine  10 mg Oral Daily  . ferrous sulfate  325 mg Oral BID WC  . heparin subcutaneous  5,000 Units Subcutaneous Q8H  . hydrALAZINE  25 mg Oral Q8H  . insulin aspart  0-9 Units Subcutaneous TID WC  . isosorbide mononitrate  30 mg Oral Daily  . metoprolol tartrate  100 mg Oral BID  . vitamin C  250 mg Oral BID   Continuous Infusions: . cefTRIAXone (ROCEPHIN)  IV 1 g (10/29/16 0557)   PRN Meds: acetaminophen **OR** acetaminophen   Vital Signs    Vitals:   10/28/16 2157 10/28/16 2248 10/29/16 0433 10/29/16 0744  BP: (!) 149/86 (!) 144/81 128/69 (!) 155/98  Pulse: 76 71 65 75  Resp:  18 13 18   Temp:  98.5 F (36.9 C) 98.6 F (37 C)   TempSrc:  Oral Oral   SpO2:  99% 100% 100%  Weight:   169 lb 8.5 oz (76.9 kg)   Height:        Intake/Output Summary (Last 24 hours) at 10/29/16 0938 Last data filed at 10/29/16 3662  Gross per 24 hour  Intake             1080 ml  Output             1000 ml  Net               80 ml   Filed Weights   10/27/16 0342 10/28/16 0648 10/29/16 0433  Weight: 158 lb 15.2 oz (72.1 kg) 162 lb 4.8 oz (73.6 kg) 169 lb 8.5 oz (76.9 kg)    Telemetry    SR, atrial tachycardia 11 beats, nsVT 5 beats - Personally Reviewed  ECG    SR - Personally Reviewed  Physical Exam   GEN: No acute distress.   Neck: No JVD Cardiac: RRR, no murmurs, rubs, or gallops.  Respiratory: Clear to auscultation bilaterally. GI: Soft, nontender, non-distended  MS: No edema; No deformity. Neuro:  Nonfocal  Psych: Normal affect   Labs    Chemistry Recent Labs Lab 10/26/16 2026 10/27/16 0536 10/28/16 0843 10/29/16 0322  NA 136 137 136 137  K 3.4* 3.7 3.1* 3.3*  CL 103 105 108 109  CO2 19* 17* 19* 18*  GLUCOSE 194* 155* 125* 109*  BUN  61* 61* 52* 58*  CREATININE 5.91* 6.20* 6.43* 6.49*  CALCIUM 9.8 9.7 8.9 8.7*  PROT 8.2* 7.4  --  6.0*  ALBUMIN 4.5 3.8  --  3.0*  AST 23 22  --  15  ALT 28 25  --  18  ALKPHOS 110 103  --  90  BILITOT 1.4* 1.5*  --  0.5  GFRNONAA 8* 7* 7* 7*  GFRAA 9* 8* 8* 8*  ANIONGAP 14 15 9 10      Hematology Recent Labs Lab 10/26/16 2026 10/27/16 0536  WBC 10.0 8.7  RBC 4.67 4.15  4.15  HGB 11.1* 9.5*  HCT 33.5* 30.6*  MCV 71.7* 73.7*  MCH 23.8* 22.9*  MCHC 33.1 31.0  RDW 16.6* 16.7*  PLT 323 260    Cardiac Enzymes Recent Labs Lab 10/27/16 0300 10/27/16 0536 10/27/16 1155 10/27/16 1753  TROPONINI 0.24* 0.25* 0.34* 0.47*   No results for input(s): TROPIPOC in the last 168 hours.  BNP Recent Labs Lab 10/26/16 2026  BNP >4,500.0*     DDimer  Recent Labs Lab 10/26/16 2026  DDIMER 1.36*     Radiology    Ct Abdomen Pelvis Wo Contrast  Result Date: 10/28/2016 CLINICAL DATA:  46 year old female with multiple organ failure in the setting of hypertensive emergency an acute renal failure. EXAM: CT ABDOMEN AND PELVIS WITHOUT CONTRAST TECHNIQUE: Multidetector CT imaging of the abdomen and pelvis was performed following the standard protocol without IV contrast. COMPARISON:  None. FINDINGS: Lower chest: The visualized lower lungs are clear. Marked cardiomegaly with left ventricular dilatation. No pericardial effusion. Unremarkable visualized thoracic esophagus. Hepatobiliary: No focal liver abnormality is seen. Status post cholecystectomy. No biliary dilatation. Pancreas: Unremarkable. No pancreatic ductal dilatation or surrounding inflammatory changes. Spleen: Normal in size without focal abnormality. Adrenals/Urinary Tract: Unremarkable adrenal glands. The kidneys are relatively small bilaterally. Left extrarenal pelvis with slight dilatation. 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 No nephrolithiasis. The bladder is decompressed. Stomach/Bowel: No evidence of obstruction or  focal bowel wall thickening. Normal appendix in the right lower quadrant. The terminal ileum is unremarkable. Vascular/Lymphatic: Limited evaluation in the absence of intravenous contrast. No aneurysm. Mild atherosclerotic vascular calcifications along the abdominal aorta. No suspicious lymphadenopathy. Reproductive: Uterus and bilateral adnexa are unremarkable. Other: Trace free fluid in the pelvic cul-de-sac may be physiologic. Musculoskeletal: No acute fracture or aggressive appearing lytic or blastic osseous lesion. L4-L5 and L5-S1 facet arthropathy on the right. IMPRESSION: 1. No acute abnormality in the abdomen or pelvis. 2. Marked cardiomegaly with left ventricular dilatation. 3. Surgical changes of prior cholecystectomy. 4. Dilated left extrarenal pelvis. 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 5.  Aortic Atherosclerosis (ICD10-170.0) 6. Right lower lumbar facet arthropathy. Electronically Signed   By: Jacqulynn Cadet M.D.   On: 10/28/2016 14:41   US Renal  Result Date: 10/27/2016 CLINICAL DATA:  Acute renal failure EXAM: RENAL / URINARY TRACT ULTRASOUND COMPLETE COMPARISON:  Report 03/13/2011 FINDINGS: Right Kidney: Length: 8.4 cm. Diffuse increased cortical echogenicity. Minimal splaying of the pelvis and calices. No focal parenchymal abnormality Left Kidney: Length: 10.9 cm. Increased cortical echogenicity. Mild hydronephrosis of probable extrarenal pelvis. No dilatation of the calices. Bladder: Bladder is empty IMPRESSION: 1. Increased cortical echogenicity bilaterally consistent with medical renal disease 2. Minimal splaying of the right renal pelvis without frank hydronephrosis. Mild left hydronephrosis of probable extrarenal pelvis. 3. Empty bladder Electronically Signed   By: Donavan Foil M.D.   On: 10/27/2016 19:38    Cardiac Studies   TTE: 10/27/16 - Left ventricle: The cavity size was normal. Wall thickness was   increased in a pattern of moderate LVH. Systolic function was    severely reduced. The estimated ejection fraction was in the   range of 20% to 25%. Diffuse hypokinesis. Features are consistent   with a pseudonormal left ventricular filling pattern, with   concomitant abnormal relaxation and increased filling pressure   (grade 2 diastolic dysfunction). Doppler parameters are   consistent with high ventricular filling pressure. - Regional wall motion abnormality: Akinesis of the mid   anteroseptal and mid inferoseptal myocardium; severe hypokinesis   of the mid anterior and mid inferior myocardium. - Mitral valve: There was mild regurgitation. - Left atrium: The atrium was severely dilated. - Right ventricle: Systolic function was moderately to severely   reduced. - Systemic veins: Dilated IVC with normal respiratory variation.   Estimated CVP 8 mmHg.    Patient Profile     46 y.o.  female   Assessment & Plan    1. Hypertensive emergency with CHF and renal failure The patient presents with markedly elevated BP, now improved.  Medical non adherence is likely the exacerbating factor.   I will increase hydralazine to 50 mg po TID.  2. Acute systolic dysfunction Biventricular failure Almost euvolemic now Hold diuretics, replace potassium On metoprolol and hydralazine, imdur 2 gram sodium diet Strict Is and Os Likely due to hypertensive disease.  Not currently a candidate for cath/ invasive procedures.  Will workup etiology of her heart failure further as she recovers.  3. Elevated troponin Demand ischemia  Not consistent with acute thrombotic MI  4. Acute renal failure Appreciated nephrology input Ultrasound reveals cortical renal disease  For questions or updates, please contact [place] Please consult [email or web address] for contact info under Cardiology/STEMI. Daytime calls, contact the Day Call APP (6a-8a) or assigned team (Teams A-D) provider (7:30a - 5p). All other daytime calls (7:30-5p), contact the Card Master @ [phone].    Nighttime calls, contact the assigned APP (5p-8p) or MD (6:30p-8p). Overnight calls (8p-6a), contact the on call Fellow @ [phone].   Signed, Ena Dawley, MD  10/29/2016, 9:38 AM

## 2016-10-29 NOTE — Clinical Social Work Note (Signed)
CSW acknowledges consults, "Pt with HTN , CKI and DM and states she has not taken medications in 2 years for financial reasons." and "Patient without PCP. Acute systolic and diastolic CHF, acute on CKD stage IV-5 most likely requiring HD in near future. Patient requires PCP." CSW will notify RNCM in morning progression meeting.  CSW signing off. Consult again if any social work needs arise.  Dayton Scrape, [place]

## 2016-10-29 NOTE — Progress Notes (Signed)
Notified Cardiology MD about 12 beat run of wide QRS complex @2141  and patient asymptomatic. No new orders received I will continue to monitor.

## 2016-10-29 NOTE — Progress Notes (Signed)
Upper Extremity Vein Map  Right Cephalic  Segment Diameter Depth Comment  1. Axilla 1.75mm 7.37mm   2. Mid upper arm 1.55mm 5.67mm   3. Above AC 3.33mm 3.69mm   4. In [facility] 6.35mm 2.82mm Thrombosis  5. Below AC 2.29mm 4.69mm   6. Mid forearm 1.54mm 3.43mm   7. Wrist 1.31mm 2.61mm                   Right Basilic Not Visualized   Left Cephalic  Segment Diameter Depth Comment  1. Axilla 3.29mm 11.39mm   2. Mid upper arm 3.53mm 6.54mm   3. Above [facility] 2.50mm 6.78mm Thrombosed  4. In [facility] 5.80mm 3.14mm Thrombosed  5. Below AC 2.38mm 5.10mm   6. Mid forearm 1.81mm 2.70mm   7. Wrist   Not visualized                  Left Basilic Not visualized  Preliminary results discussed with Marita Kansas, charge RN.  10/29/2016 2:39 PM Maudry Mayhew, BS, RVT, RDCS, RDMS

## 2016-10-29 NOTE — Progress Notes (Signed)
[place] TEAM 1 - Stepdown/ICU TEAM  Mallory Collier  SWF:[id number] DOB: January 21, 1971 DOA: 10/26/2016 PCP: Patient, No Pcp Per    Brief Narrative:  46 y.o.F w/ a hx of HTN, DM2, and noncompliance (has not seen a PCP in 2 years) who presented to the ER with epigastric abdominal discomfort and exertional shortness of breath.   In the ER the patient was found to have a systolic blood pressure >220 and a creatinine of 5.9. UA was c/w UTI.  Subjective: The patient is resting comfortably in bed.  She denies chest pain shortness breath fevers chills nausea and vomiting.  She is alert and oriented.  Assessment & Plan:  Newly diagnosed acute systolic and diastolic CHF Approaching euvolemic state - Cardiology following - likely due to uncontrolled hypertension - for further outpatient workup when more stable - continue beta blocker - no Ace due to renal disease  HTN emergency - uncontrolled HTN Due to noncompliance with medical therapy - blood pressure now much better controlled - follow w/o change in tx today   CKD acutely worsened to stage V Last known creatinine 1.5 in 2011 - Nephrology following - appears her current impaired function is not likely reversible and she is on her way to dialysis - awaiting vein mapping to plan permanent access - Nephrology following   Recent Labs Lab 10/26/16 2026 10/27/16 0536 10/28/16 0843 10/29/16 0322  CREATININE 5.91* 6.20* 6.43* 6.49*    Microcytic anemia  likey related to CKD - Fe tx has been initiated   Hypokalemia Gently supplement and follow  DM 2 CBG controlled at this time   UTI Urine culture has not proven to be helpful - patient currently asymptomatic - will stop antibiotic now having completed 3 full days of treatment  DVT prophylaxis: Subcutaneous heparin Code Status: FULL CODE Family Communication: no family present at time of exam  Disposition Plan: tele bed - home when vein mapping completed and med titration finished  (likley 9/11)  Consultants:  Cardiology Nephrology  Procedures: TTE - 9/8 - EF 20-25% - diffuse hypokinesis - grade 2 DD  Antimicrobials:  Ceftriaxone 9/7 > 9/10  Objective: Blood pressure 139/88, pulse 65, temperature 97.8 F (36.6 C), temperature source Oral, resp. rate 18, height 5\' 5"  (1.651 m), weight 76.9 kg (169 lb 8.5 oz), last menstrual period 10/12/2016, SpO2 100 %.  Intake/Output Summary (Last 24 hours) at 10/29/16 1334 Last data filed at 10/29/16 0937  Gross per 24 hour  Intake              720 ml  Output             1000 ml  Net             -280 ml   Filed Weights   10/27/16 0342 10/28/16 0648 10/29/16 0433  Weight: 72.1 kg (158 lb 15.2 oz) 73.6 kg (162 lb 4.8 oz) 76.9 kg (169 lb 8.5 oz)    Examination: General: No acute respiratory distress Lungs: Clear to auscultation bilaterally without wheezes or crackles Cardiovascular: Regular rate and rhythm without murmur gallop or rub normal S1 and S2 Abdomen: Nontender, nondistended, soft, bowel sounds positive, no rebound, no ascites, no appreciable mass Extremities: No significant cyanosis, clubbing, or edema bilateral lower extremities  CBC:  Recent Labs Lab 10/26/16 2026 10/27/16 0536  WBC 10.0 8.7  NEUTROABS 7.1 6.2  HGB 11.1* 9.5*  HCT 33.5* 30.6*  MCV 71.7* 73.7*  PLT 323 254   Basic Metabolic Panel:  Recent Labs Lab 10/26/16 2026 10/27/16 0536 10/28/16 0456 10/28/16 0843 10/29/16 0322  NA 136 137  --  136 137  K 3.4* 3.7  --  3.1* 3.3*  CL 103 105  --  108 109  CO2 19* 17*  --  19* 18*  GLUCOSE 194* 155*  --  125* 109*  BUN 61* 61*  --  52* 58*  CREATININE 5.91* 6.20*  --  6.43* 6.49*  CALCIUM 9.8 9.7  --  8.9 8.7*  MG  --   --  2.3 2.6*  --   PHOS  --  4.1  --   --   --    GFR: Estimated Creatinine Clearance: 11.1 mL/min (A) (by C-G formula based on SCr of 6.49 mg/dL (H)).  Liver Function Tests:  Recent Labs Lab 10/26/16 2026 10/27/16 0536 10/29/16 0322  AST 23 22 15   ALT  28 25 18   ALKPHOS 110 103 90  BILITOT 1.4* 1.5* 0.5  PROT 8.2* 7.4 6.0*  ALBUMIN 4.5 3.8 3.0*    Recent Labs Lab 10/26/16 2026 10/27/16 0536  LIPASE 44 46    Cardiac Enzymes:  Recent Labs Lab 10/26/16 2026 10/27/16 0300 10/27/16 0536 10/27/16 1155 10/27/16 1753  CKTOTAL  --   --  72  --   --   TROPONINI 0.25* 0.24* 0.25* 0.34* 0.47*    HbA1C: Hgb A1c MFr Bld  Date/Time Value Ref Range Status  10/27/2016 05:36 AM 6.3 (H) 4.8 - 5.6 % Final    Comment:    (NOTE) Pre diabetes:          5.7%-6.4% Diabetes:              >6.4% Glycemic control for   <7.0% adults with diabetes     CBG:  Recent Labs Lab 10/27/16 2126 10/28/16 1601 10/28/16 2059 10/29/16 0803 10/29/16 1212  GLUCAP 149* 136* 125* 88 106*    Recent Results (from the past 240 hour(s))  MRSA PCR Screening     Status: None   Collection Time: 10/27/16  3:41 AM  Result Value Ref Range Status   MRSA by PCR NEGATIVE NEGATIVE Final    Comment:        The GeneXpert MRSA Assay (FDA approved for NASAL specimens only), is one component of a comprehensive MRSA colonization surveillance program. It is not intended to diagnose MRSA infection nor to guide or monitor treatment for MRSA infections.   Culture, Urine     Status: None   Collection Time: 10/28/16  2:59 AM  Result Value Ref Range Status   Specimen Description URINE, CATHETERIZED  Final   Special Requests NONE  Final   Culture NO GROWTH  Final   Report Status 10/29/2016 FINAL  Final     Scheduled Meds: . amLODipine  10 mg Oral Daily  . ferrous sulfate  325 mg Oral BID WC  . heparin subcutaneous  5,000 Units Subcutaneous Q8H  . hydrALAZINE  50 mg Oral Q8H  . insulin aspart  0-9 Units Subcutaneous TID WC  . isosorbide mononitrate  30 mg Oral Daily  . metoprolol tartrate  100 mg Oral BID  . potassium chloride  20 mEq Oral Daily  . vitamin C  250 mg Oral BID     LOS: 3 days   Cherene Altes, MD [facility] Office   [phone] Pager - Text Page per Amion as per below:  On-Call/Text Page:      [email or web address]      password  [email or web address]  If 7PM-7AM, please contact night-coverage [email or web address] Password [email or web address] 10/29/2016, 1:34 PM

## 2016-10-30 DIAGNOSIS — N179 Acute kidney failure, unspecified: Secondary | ICD-10-CM

## 2016-10-30 DIAGNOSIS — E1121 Type 2 diabetes mellitus with diabetic nephropathy: Secondary | ICD-10-CM

## 2016-10-30 DIAGNOSIS — N185 Chronic kidney disease, stage 5: Secondary | ICD-10-CM

## 2016-10-30 DIAGNOSIS — I5041 Acute combined systolic (congestive) and diastolic (congestive) heart failure: Secondary | ICD-10-CM

## 2016-10-30 DIAGNOSIS — Q6211 Congenital occlusion of ureteropelvic junction: Secondary | ICD-10-CM

## 2016-10-30 DIAGNOSIS — I110 Hypertensive heart disease with heart failure: Secondary | ICD-10-CM

## 2016-10-30 DIAGNOSIS — D509 Iron deficiency anemia, unspecified: Secondary | ICD-10-CM

## 2016-10-30 LAB — GLUCOSE, CAPILLARY
GLUCOSE-CAPILLARY: 85 mg/dL (ref 65–99)
Glucose-Capillary: 102 mg/dL — ABNORMAL HIGH (ref 65–99)

## 2016-10-30 LAB — BASIC METABOLIC PANEL
Anion gap: 12 (ref 5–15)
BUN: 54 mg/dL — AB (ref 6–20)
CALCIUM: 8.9 mg/dL (ref 8.9–10.3)
CHLORIDE: 104 mmol/L (ref 101–111)
CO2: 19 mmol/L — AB (ref 22–32)
Creatinine, Ser: 6.47 mg/dL — ABNORMAL HIGH (ref 0.44–1.00)
GFR calc Af Amer: 8 mL/min — ABNORMAL LOW (ref >60)
GFR, EST NON AFRICAN AMERICAN: 7 mL/min — AB (ref >60)
GLUCOSE: 110 mg/dL — AB (ref 65–99)
Potassium: 3.3 mmol/L — ABNORMAL LOW (ref 3.5–5.1)
Sodium: 135 mmol/L (ref 135–145)

## 2016-10-30 MED ORDER — AMLODIPINE BESYLATE 10 MG PO TABS: 10.0000 mg | ORAL_TABLET | Freq: Every day | ORAL | 0 refills | Status: DC

## 2016-10-30 MED ORDER — VITAMIN C 250 MG PO TABS: 250.0000 mg | ORAL_TABLET | Freq: Two times a day (BID) | ORAL | 0 refills | Status: AC

## 2016-10-30 MED ORDER — POTASSIUM CHLORIDE ER 20 MEQ PO TBCR: 20.0000 meq | EXTENDED_RELEASE_TABLET | Freq: Every day | ORAL | 0 refills | Status: DC

## 2016-10-30 MED ORDER — ISOSORBIDE MONONITRATE ER 30 MG PO TB24: 30.0000 mg | ORAL_TABLET | Freq: Every day | ORAL | 0 refills | Status: DC

## 2016-10-30 MED ORDER — METOPROLOL TARTRATE 100 MG PO TABS: 100.0000 mg | ORAL_TABLET | Freq: Two times a day (BID) | ORAL | 0 refills | Status: DC

## 2016-10-30 MED ORDER — HYDRALAZINE HCL 50 MG PO TABS: 50.0000 mg | ORAL_TABLET | Freq: Three times a day (TID) | ORAL | 0 refills | Status: DC

## 2016-10-30 MED ORDER — FERROUS SULFATE 325 (65 FE) MG PO TABS: 325.0000 mg | ORAL_TABLET | Freq: Two times a day (BID) | ORAL | 0 refills | Status: AC

## 2016-10-30 NOTE — Progress Notes (Signed)
Patient discharged to home with mother; took all belongings and paperwork, IV removed.

## 2016-10-30 NOTE — Discharge Summary (Signed)
Physician Discharge Summary  Mallory Collier QQP:[id number] DOB: 08-07-1970 DOA: 10/26/2016  PCP: Patient, No Pcp Per  Admit date: 10/26/2016 Discharge date: 10/30/2016  Time spent: 35 minutes  Recommendations for Outpatient Follow-up:  Acute Systolic and Diastolic CHF -Echocardiogram: Consistent with acute systolic and diastolic CHF see results below -Amlodipine 10 mg daily -Hydralazine 50 mg TID  -Imdur 30 mg daily -Metoprolol 100 mg BID -Schedule follow-up in 1-2 weeks with Dr. 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 Counseled patient on process for beginning HD. -SPEP (WNL),  -Acute hepatitis panel negative -HIV negative: -Renal ultrasound: Shows medical renal disease see results below -Spoke with Dr. Edrick Oh Nephrology does not believe patient requires placement of Vas-Cath at this time. Schedule follow-up in 1-2 weeks with Dr. Edrick Oh acute on CKD stage IV -Obtain CT abdomen pelvis W/O contrast per nephrology recommendation: Mild left hydronephrosis of probable extrarenal pelvis. Mass?  LEFT Ureteropelvic junction (UPJ) stenosis. -Nephrology recommends urology consult as outpatient. -Schedule establish care with [facility] 1-2 weeks workup left UPJ stenosis   Microcytic hypochromic anemia -Anemia panel consistent with iron deficiency anemia, Nephrology administered a dose of Ferralicit -Iron FeSO4 712 mg BID + vitamin C 250mg  BID   Abnormal thyroid function -TSH WNL, Free T4 slightly elevated. Would not start any medication during patient's acute illness. -PCP to repeat TSH in 4-6 weeks   Diabetes type 2 controlled with renal  complications -9/8 Hemoglobin A1c= 6.3   UTI -Completed course of antibiotics      Goals of care -Patient asked that if family is around we not discuss her current medical situation -9/9 consult to LCSW patient will require PCP.     Discharge Diagnoses:  Principal Problem:   Hypertensive emergency Active Problems:   Elevated troponin   AKI (acute kidney injury) ([place])   CHF (congestive heart failure) (HCC)   Type 2 diabetes mellitus with renal manifestations (HCC)   Microcytic hypochromic anemia   Discharge Condition: Stable  Diet recommendation: Heart healthy  Filed Weights   10/28/16 0648 10/29/16 0433 10/30/16 0402  Weight: 162 lb 4.8 oz (73.6 kg) 169 lb 8.5 oz (76.9 kg) 169 lb 5 oz (76.8 kg)    History of present illness:  46 y.o. BF PMHx HTN, Diabetes Type 2 uncontrolled, Noncompliance with Medication Regimen, (has not seen PCP in 2 years),    Presents to the ER with complaints of abdominal discomfort and elevated blood pressure. Patient states over the last 3 days patient has been having abdominal discomfort mostly in the epigastric area. Denies any nausea vomiting or diarrhea. Over the last 2 weeks patient also has been experiencing exertional shortness of breath. Denies any chest pain and productive cough fever or chills. For the last 3 days patient has been taking Aleve for the abdominal discomfort. Though patient has not followed up with her primary care she at times takes Diovan which she still has with her for her blood pressure when she feels that her blood pressure may be running high. After long patient checked her blood pressure yesterday and was found to be elevated.  During his hospitalization patient diagnosed with acute on CKD stage V. Nephrology consulted and  evaluated patient felt little chance of recovery of renal function and the patient would need HD in the near future. Patient reluctant to have a fistula placed during this hospitalization, however vein  mapping was accomplished. From renal standpoint cleared for discharge. In addition patient's hospitalization, complicated by acute systolic and diastolic CHF secondary to uncontrolled HTN. Multiple medications added/adjusted patient's BP now controlled. Stable for discharge      Procedures: 9/8 Echocardiogram: LEFT ventricle: Moderate LVH.-LVEF= 20-25%, diffuse hypokinesis -Grade 2 diastolic dysfunction,mid anteroseptal and mid inferoseptal myocardium; severe hypokinesis   of the mid anterior and mid inferior myocardium. -LEFT atrium: Severely dilated. 9/8 Renal Ultrasound:-Increase cortical echogenicity bilaterally C/W medical renal disease -Minimal splaying right renal pelvis without frank hydronephrosis. Mild left hydronephrosis of probable extrarenal pelvis. 9/9 CT abdomen pelvis:-Negative Acute abnormality in the abdomen/pelvis. -Marked cardiomegaly with left ventricular dilatation. - Dilated LEFT extrarenal pelvis. Suspect an element of underlying Ureteropelvic junction  (UPJ) stenosis.    Consultations: Nephrology Cardiology   Cultures  9/8 HIV negative 9/8 Acute hepatitis panel negative 9/9 urine negative   Antibiotics Anti-infectives    Start     Stop   10/27/16 0600  cefTRIAXone (ROCEPHIN) 1 g in dextrose 5 % 50 mL IVPB  Status:  Discontinued     10/29/16 1410       Discharge Exam: Vitals:   10/29/16 1914 10/29/16 2259 10/30/16 0402 10/30/16 0746  BP: (!) 141/86 (!) 149/91 (!) 142/79 125/75  Pulse: 71 72 64 67  Resp: 17 13 14 16   Temp: 98.1 F (36.7 C) 98.8 F (37.1 C) 98.7 F (37.1 C) 97.9 F (36.6 C)  TempSrc: Oral Oral Oral Oral  SpO2: 100% 100% 99% 99%  Weight:   169 lb 5 oz (76.8 kg)   Height:        General: A/O 4, No acute respiratory distress Neck:  Negative scars, masses, torticollis, lymphadenopathy, JVD Lungs: Clear to auscultation bilaterally without wheezes or crackles Cardiovascular: Regular rate and rhythm without murmur gallop or  rub normal S1 and S2   Discharge Instructions   Allergies as of 10/30/2016   No Known Allergies     Medication List    STOP taking these medications   multivitamin with minerals Tabs tablet     TAKE these medications   amLODipine 10 MG tablet Commonly known as:  NORVASC Take 1 tablet (10 mg total) by mouth daily.   ferrous sulfate 325 (65 FE) MG tablet Take 1 tablet (325 mg total) by mouth 2 (two) times daily with a meal.   hydrALAZINE 50 MG tablet Commonly known as:  APRESOLINE Take 1 tablet (50 mg total) by mouth every 8 (eight) hours.   isosorbide mononitrate 30 MG 24 hr tablet Commonly known as:  IMDUR Take 1 tablet (30 mg total) by mouth daily.   metoprolol tartrate 100 MG tablet Commonly known as:  LOPRESSOR Take 1 tablet (100 mg total) by mouth 2 (two) times daily.   Potassium Chloride ER 20 MEQ Tbcr Take 20 mEq by mouth daily.   vitamin C 250 MG tablet Commonly known as:  ASCORBIC ACID Take 1 tablet (250 mg total) by mouth 2 (two) times daily. What changed:  when to take this            Discharge Care Instructions        Start     Ordered   10/31/16 0000  amLODipine (NORVASC) 10 MG tablet  Daily     10/30/16 1006  10/31/16 0000  isosorbide mononitrate (IMDUR) 30 MG 24 hr tablet  Daily     10/30/16 1006   10/31/16 0000  potassium chloride 20 MEQ TBCR  Daily     10/30/16 1006   10/30/16 0000  vitamin C (ASCORBIC ACID) 250 MG tablet  2 times daily     10/30/16 1006   10/30/16 0000  ferrous sulfate 325 (65 FE) MG tablet  2 times daily with meals     10/30/16 1006   10/30/16 0000  hydrALAZINE (APRESOLINE) 50 MG tablet  Every 8 hours     10/30/16 1006   10/30/16 0000  metoprolol tartrate (LOPRESSOR) 100 MG tablet  2 times daily     10/30/16 1006     No Known Allergies Follow-up Information    Edrick Oh, MD. Schedule an appointment as soon as possible for a visit in 2 weeks.   Specialty:  Nephrology Why:  Schedule follow-up in 1-2 weeks  with Dr. Edrick Oh acute on CKD stage IV    Office will PT. Contact information: [place] [address] [phone]        Dorothy Spark, MD. Schedule an appointment as soon as possible for a visit in 18 days.   Specialty:  Cardiology Why:  Schedule follow-up in 1-2 weeks with Dr. Luna Fuse CHF, hypertensive emergency  For 11/06/2016 @  11:15 AM Contact information: [address] [place] [address] [place]. Schedule an appointment as soon as possible for a visit in 1 week(s).   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    Significant Diagnostic Studies: Ct Abdomen Pelvis Wo Contrast  Result Date: 10/28/2016 CLINICAL DATA:  46 year old female with multiple organ failure in the setting of hypertensive emergency an acute renal failure. EXAM: CT ABDOMEN AND PELVIS WITHOUT CONTRAST TECHNIQUE: Multidetector CT imaging of the abdomen and pelvis was performed following the standard protocol without IV contrast. COMPARISON:  None. FINDINGS: Lower chest: The visualized lower lungs are clear. Marked cardiomegaly with left ventricular dilatation. No pericardial effusion. Unremarkable visualized thoracic esophagus. Hepatobiliary: No focal liver abnormality is seen. Status post cholecystectomy. No biliary dilatation. Pancreas: Unremarkable. No pancreatic ductal dilatation or surrounding inflammatory changes. Spleen: Normal in size without focal abnormality. Adrenals/Urinary Tract: Unremarkable  adrenal glands. The kidneys are relatively small bilaterally. Left extrarenal pelvis with slight dilatation. 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 No nephrolithiasis. The bladder is decompressed. Stomach/Bowel: No evidence of obstruction or focal bowel wall thickening. Normal appendix in the right lower quadrant. The terminal ileum is unremarkable. Vascular/Lymphatic: Limited evaluation in the absence of intravenous contrast. No aneurysm. Mild atherosclerotic vascular calcifications along the abdominal aorta. No suspicious lymphadenopathy. Reproductive: Uterus and bilateral adnexa are unremarkable. Other: Trace free fluid in the pelvic cul-de-sac may be physiologic. Musculoskeletal: No acute fracture or aggressive appearing lytic or blastic osseous lesion. L4-L5 and L5-S1 facet arthropathy on the right. IMPRESSION: 1. No acute abnormality in the abdomen or pelvis. 2. Marked cardiomegaly with left ventricular dilatation. 3. Surgical changes of prior cholecystectomy. 4.  Dilated left extrarenal pelvis. 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 5.  Aortic Atherosclerosis (ICD10-170.0) 6. Right lower lumbar facet arthropathy. Electronically Signed   By: Jacqulynn Cadet M.D.   On: 10/28/2016 14:41   US Renal  Result Date: 10/27/2016 CLINICAL DATA:  Acute renal failure EXAM: RENAL / URINARY TRACT ULTRASOUND COMPLETE COMPARISON:  Report 03/13/2011 FINDINGS: Right Kidney: Length: 8.4 cm. Diffuse increased cortical echogenicity. Minimal splaying of the pelvis and calices. No focal parenchymal abnormality Left Kidney: Length: 10.9 cm. Increased cortical echogenicity. Mild hydronephrosis of probable extrarenal pelvis. No dilatation of the calices. Bladder: Bladder is empty IMPRESSION: 1. Increased cortical echogenicity bilaterally consistent with medical renal disease 2. Minimal splaying of the right renal pelvis without frank hydronephrosis. Mild left hydronephrosis of probable extrarenal pelvis. 3. Empty  bladder Electronically Signed   By: Donavan Foil M.D.   On: 10/27/2016 19:38   Dg Chest Port 1 View  Result Date: 10/26/2016 CLINICAL DATA:  46 year old female with history of acute onset of hypertension today. EXAM: PORTABLE CHEST 1 VIEW COMPARISON:  Chest x-ray a 02/07/2010. FINDINGS: There is mild cephalization of the pulmonary vasculature and slight indistinctness of the interstitial markings suggestive of mild pulmonary edema. No acute consolidative airspace disease. No definite pleural effusions. Mild cardiomegaly. The patient is rotated to the left on today's exam, resulting in distortion of the mediastinal contours and reduced diagnostic sensitivity and specificity for mediastinal pathology. IMPRESSION: 1. The appearance of the chest suggests very mild congestive heart failure, as above. Electronically Signed   By: Vinnie Langton M.D.   On: 10/26/2016 21:03    Microbiology: Recent Results (from the past 240 hour(s))  MRSA PCR Screening     Status: None   Collection Time: 10/27/16  3:41 AM  Result Value Ref Range Status   MRSA by PCR NEGATIVE NEGATIVE Final    Comment:        The GeneXpert MRSA Assay (FDA approved for NASAL specimens only), is one component of a comprehensive MRSA colonization surveillance program. It is not intended to diagnose MRSA infection nor to guide or monitor treatment for MRSA infections.   Culture, Urine     Status: None   Collection Time: 10/28/16  2:59 AM  Result Value Ref Range Status   Specimen Description URINE, CATHETERIZED  Final   Special Requests NONE  Final   Culture NO GROWTH  Final   Report Status 10/29/2016 FINAL  Final     Labs: Basic Metabolic Panel:  Recent Labs Lab 10/26/16 2026 10/27/16 0536 10/28/16 0456 10/28/16 0843 10/29/16 0322 10/30/16 0124  NA 136 137  --  136 137 135  K 3.4* 3.7  --  3.1* 3.3* 3.3*  CL 103 105  --  108 109 104  CO2 19* 17*  --  19* 18* 19*  GLUCOSE 194* 155*  --  125* 109* 110*  BUN 61* 61*   --  52* 58* 54*  CREATININE 5.91* 6.20*  --  6.43* 6.49* 6.47*  CALCIUM 9.8 9.7  --  8.9 8.7* 8.9  MG  --   --  2.3 2.6*  --   --   PHOS  --  4.1  --   --   --   --    Liver Function Tests:  Recent Labs Lab 10/26/16 2026 10/27/16 0536 10/29/16 0322  AST 23 22 15   ALT 28 25 18   ALKPHOS 110 103 90  BILITOT 1.4* 1.5* 0.5  PROT 8.2* 7.4 6.0*  ALBUMIN  4.5 3.8 3.0*    Recent Labs Lab 10/26/16 2026 10/27/16 0536  LIPASE 44 46   No results for input(s): AMMONIA in the last 168 hours. CBC:  Recent Labs Lab 10/26/16 2026 10/27/16 0536  WBC 10.0 8.7  NEUTROABS 7.1 6.2  HGB 11.1* 9.5*  HCT 33.5* 30.6*  MCV 71.7* 73.7*  PLT 323 260   Cardiac Enzymes:  Recent Labs Lab 10/26/16 2026 10/27/16 0300 10/27/16 0536 10/27/16 1155 10/27/16 1753  CKTOTAL  --   --  72  --   --   TROPONINI 0.25* 0.24* 0.25* 0.34* 0.47*   BNP: BNP (last 3 results)  Recent Labs  10/26/16 2026  BNP >4,500.0*    ProBNP (last 3 results) No results for input(s): PROBNP in the last 8760 hours.  CBG:  Recent Labs Lab 10/29/16 0803 10/29/16 1212 10/29/16 1709 10/29/16 2103 10/30/16 0743  GLUCAP 88 106* 125* 89 85       Signed:  Dia Crawford, MD [facility] [phone] pager

## 2016-10-30 NOTE — Progress Notes (Signed)
Mallory Collier ROUNDING NOTE   Subjective:   This is a 46 year old lady that has diabetes and hypertension  She has not been to her PCP in over 2 years and has not been compliant with her antihypertensive medications She presented to the ER with shortness of breath and abdominal pain  She was found to have a blood pressure of over 200 mmHg     Her renal function shows a creatinine of over 6 and GFR < 15.    Her creatinine has been stable for the past several days.  She has had vein mapping complete.    Objective:  Vital signs in last 24 hours:  Temp:  [97.8 F (36.6 C)-98.8 F (37.1 C)] 97.9 F (36.6 C) (09/11 0746) Pulse Rate:  [64-72] 67 (09/11 0746) Resp:  [13-20] 16 (09/11 0746) BP: (125-149)/(75-91) 125/75 (09/11 0746) SpO2:  [99 %-100 %] 99 % (09/11 0746) Weight:  [76.8 kg (169 lb 5 oz)] 76.8 kg (169 lb 5 oz) (09/11 0402)  Weight change: -0.1 kg (-3.5 oz) Filed Weights   10/28/16 0648 10/29/16 0433 10/30/16 0402  Weight: 73.6 kg (162 lb 4.8 oz) 76.9 kg (169 lb 8.5 oz) 76.8 kg (169 lb 5 oz)    Intake/Output: I/O last 3 completed shifts: In: 720 [P.O.:720] Out: 1600 [Urine:1600]   Intake/Output this shift:  No intake/output data recorded. GEN: awake and alert, eating breakfast CVS- RRR RS- CTA ABD- BS present soft non-distended EXT- no edema   Basic Metabolic Panel:  Recent Labs Lab 10/26/16 2026 10/27/16 0536 10/28/16 0456 10/28/16 0843 10/29/16 0322 10/30/16 0124  NA 136 137  --  136 137 135  K 3.4* 3.7  --  3.1* 3.3* 3.3*  CL 103 105  --  108 109 104  CO2 19* 17*  --  19* 18* 19*  GLUCOSE 194* 155*  --  125* 109* 110*  BUN 61* 61*  --  52* 58* 54*  CREATININE 5.91* 6.20*  --  6.43* 6.49* 6.47*  CALCIUM 9.8 9.7  --  8.9 8.7* 8.9  MG  --   --  2.3 2.6*  --   --   PHOS  --  4.1  --   --   --   --     Liver Function Tests:  Recent Labs Lab 10/26/16 2026 10/27/16 0536 10/29/16 0322  AST 23 22 15   ALT 28 25 18   ALKPHOS 110 103 90   BILITOT 1.4* 1.5* 0.5  PROT 8.2* 7.4 6.0*  ALBUMIN 4.5 3.8 3.0*    Recent Labs Lab 10/26/16 2026 10/27/16 0536  LIPASE 44 46   No results for input(s): AMMONIA in the last 168 hours.  CBC:  Recent Labs Lab 10/26/16 2026 10/27/16 0536  WBC 10.0 8.7  NEUTROABS 7.1 6.2  HGB 11.1* 9.5*  HCT 33.5* 30.6*  MCV 71.7* 73.7*  PLT 323 260    Cardiac Enzymes:  Recent Labs Lab 10/26/16 2026 10/27/16 0300 10/27/16 0536 10/27/16 1155 10/27/16 1753  CKTOTAL  --   --  72  --   --   TROPONINI 0.25* 0.24* 0.25* 0.34* 0.47*    BNP: Invalid input(s): POCBNP  CBG:  Recent Labs Lab 10/29/16 0803 10/29/16 1212 10/29/16 1709 10/29/16 2103 10/30/16 0743  GLUCAP 88 106* 125* 89 85    Microbiology: Results for orders placed or performed during the hospital encounter of 10/26/16  MRSA PCR Screening     Status: None   Collection Time: 10/27/16  3:41 AM  Result Value Ref Range Status   MRSA by PCR NEGATIVE NEGATIVE Final    Comment:        The GeneXpert MRSA Assay (FDA approved for NASAL specimens only), is one component of a comprehensive MRSA colonization surveillance program. It is not intended to diagnose MRSA infection nor to guide or monitor treatment for MRSA infections.   Culture, Urine     Status: None   Collection Time: 10/28/16  2:59 AM  Result Value Ref Range Status   Specimen Description URINE, CATHETERIZED  Final   Special Requests NONE  Final   Culture NO GROWTH  Final   Report Status 10/29/2016 FINAL  Final    Coagulation Studies: No results for input(s): LABPROT, INR in the last 72 hours.  Urinalysis: No results for input(s): COLORURINE, LABSPEC, PHURINE, GLUCOSEU, HGBUR, BILIRUBINUR, KETONESUR, PROTEINUR, UROBILINOGEN, NITRITE, LEUKOCYTESUR in the last 72 hours.  Invalid input(s): APPERANCEUR    Imaging: Ct Abdomen Pelvis Wo Contrast  Result Date: 10/28/2016 CLINICAL DATA:  46 year old female with multiple organ failure in the setting  of hypertensive emergency an acute renal failure. EXAM: CT ABDOMEN AND PELVIS WITHOUT CONTRAST TECHNIQUE: Multidetector CT imaging of the abdomen and pelvis was performed following the standard protocol without IV contrast. COMPARISON:  None. FINDINGS: Lower chest: The visualized lower lungs are clear. Marked cardiomegaly with left ventricular dilatation. No pericardial effusion. Unremarkable visualized thoracic esophagus. Hepatobiliary: No focal liver abnormality is seen. Status post cholecystectomy. No biliary dilatation. Pancreas: Unremarkable. No pancreatic ductal dilatation or surrounding inflammatory changes. Spleen: Normal in size without focal abnormality. Adrenals/Urinary Tract: Unremarkable adrenal glands. The kidneys are relatively small bilaterally. Left extrarenal pelvis with slight dilatation. 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 No nephrolithiasis. The bladder is decompressed. Stomach/Bowel: No evidence of obstruction or focal bowel wall thickening. Normal appendix in the right lower quadrant. The terminal ileum is unremarkable. Vascular/Lymphatic: Limited evaluation in the absence of intravenous contrast. No aneurysm. Mild atherosclerotic vascular calcifications along the abdominal aorta. No suspicious lymphadenopathy. Reproductive: Uterus and bilateral adnexa are unremarkable. Other: Trace free fluid in the pelvic cul-de-sac may be physiologic. Musculoskeletal: No acute fracture or aggressive appearing lytic or blastic osseous lesion. L4-L5 and L5-S1 facet arthropathy on the right. IMPRESSION: 1. No acute abnormality in the abdomen or pelvis. 2. Marked cardiomegaly with left ventricular dilatation. 3. Surgical changes of prior cholecystectomy. 4. Dilated left extrarenal pelvis. 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 5.  Aortic Atherosclerosis (ICD10-170.0) 6. Right lower lumbar facet arthropathy. Electronically Signed   By: Jacqulynn Cadet M.D.   On: 10/28/2016 14:41      Medications:    . amLODipine  10 mg Oral Daily  . ferrous sulfate  325 mg Oral BID WC  . heparin subcutaneous  5,000 Units Subcutaneous Q8H  . hydrALAZINE  50 mg Oral Q8H  . insulin aspart  0-9 Units Subcutaneous TID WC  . isosorbide mononitrate  30 mg Oral Daily  . metoprolol tartrate  100 mg Oral BID  . potassium chloride  20 mEq Oral Daily  . vitamin C  250 mg Oral BID   acetaminophen **OR** acetaminophen  Assessment/ Plan:   CKD stage 5  It appears that she has fairly advanced renal failure and I suspect that this is due to uncontrolled hypertension.  Last known Cr of 1.5 in 2011. She does have a nephrologist in [facility] and has been told that the kidney function was stable about 1 year ago. She had no  other details. HIV is negative. Her renal ultrasound showed mild hydronephrosis on the Left and diffuse echogenicity.  I don't suspect there is much reversibility here.  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 S/p vein mapping.  Discussed that she will need a fistula in the very near future but she is hesitant to do it during this hospitalization.  From my standpoint she is able to discharge with close followup.  She wishes to transfer care to [facility] so she has an appointment with Dr. Justin Mend on 9/19 at 3:15 pm (arrive at 2:45).  Anemia  There appears to be iron deficiency anemia and this is mild s/p Ferrlicet  Bones   PTH pending   HTN  amlodipine, hydralazine, imdur, metaop      LOS: 4 [place] @TODAY @9 :13 AM

## 2016-10-30 NOTE — Progress Notes (Signed)
Progress Note  Patient Name: Mallory Collier Date of Encounter: 10/30/2016  Primary Cardiologist: New patient  Subjective   She feels better today, started to walk.  Inpatient Medications    Scheduled Meds: . amLODipine  10 mg Oral Daily  . ferrous sulfate  325 mg Oral BID WC  . heparin subcutaneous  5,000 Units Subcutaneous Q8H  . hydrALAZINE  50 mg Oral Q8H  . insulin aspart  0-9 Units Subcutaneous TID WC  . isosorbide mononitrate  30 mg Oral Daily  . metoprolol tartrate  100 mg Oral BID  . potassium chloride  20 mEq Oral Daily  . vitamin C  250 mg Oral BID   Continuous Infusions:  PRN Meds: acetaminophen **OR** acetaminophen   Vital Signs    Vitals:   10/29/16 1914 10/29/16 2259 10/30/16 0402 10/30/16 0746  BP: (!) 141/86 (!) 149/91 (!) 142/79 125/75  Pulse: 71 72 64 67  Resp: 17 13 14 16   Temp: 98.1 F (36.7 C) 98.8 F (37.1 C) 98.7 F (37.1 C) 97.9 F (36.6 C)  TempSrc: Oral Oral Oral Oral  SpO2: 100% 100% 99% 99%  Weight:   169 lb 5 oz (76.8 kg)   Height:        Intake/Output Summary (Last 24 hours) at 10/30/16 0954 Last data filed at 10/29/16 2300  Gross per 24 hour  Intake              360 ml  Output              600 ml  Net             -240 ml   Filed Weights   10/28/16 0648 10/29/16 0433 10/30/16 0402  Weight: 162 lb 4.8 oz (73.6 kg) 169 lb 8.5 oz (76.9 kg) 169 lb 5 oz (76.8 kg)    Telemetry    SR, atrial tachycardia 11 beats, nsVT 5 beats - Personally Reviewed  ECG    SR - Personally Reviewed  Physical Exam   GEN: No acute distress.   Neck: No JVD Cardiac: RRR, no murmurs, rubs, or gallops.  Respiratory: Clear to auscultation bilaterally. GI: Soft, nontender, non-distended  MS: No edema; No deformity. Neuro:  Nonfocal  Psych: Normal affect   Labs    Chemistry  Recent Labs Lab 10/26/16 2026 10/27/16 0536 10/28/16 0843 10/29/16 0322 10/30/16 0124  NA 136 137 136 137 135  K 3.4* 3.7 3.1* 3.3* 3.3*  CL 103 105 108  109 104  CO2 19* 17* 19* 18* 19*  GLUCOSE 194* 155* 125* 109* 110*  BUN 61* 61* 52* 58* 54*  CREATININE 5.91* 6.20* 6.43* 6.49* 6.47*  CALCIUM 9.8 9.7 8.9 8.7* 8.9  PROT 8.2* 7.4  --  6.0*  --   ALBUMIN 4.5 3.8  --  3.0*  --   AST 23 22  --  15  --   ALT 28 25  --  18  --   ALKPHOS 110 103  --  90  --   BILITOT 1.4* 1.5*  --  0.5  --   GFRNONAA 8* 7* 7* 7* 7*  GFRAA 9* 8* 8* 8* 8*  ANIONGAP 14 15 9 10 12      Hematology  Recent Labs Lab 10/26/16 2026 10/27/16 0536  WBC 10.0 8.7  RBC 4.67 4.15  4.15  HGB 11.1* 9.5*  HCT 33.5* 30.6*  MCV 71.7* 73.7*  MCH 23.8* 22.9*  MCHC 33.1 31.0  RDW 16.6* 16.7*  PLT 323 260    Cardiac Enzymes  Recent Labs Lab 10/27/16 0300 10/27/16 0536 10/27/16 1155 10/27/16 1753  TROPONINI 0.24* 0.25* 0.34* 0.47*   No results for input(s): TROPIPOC in the last 168 hours.   BNP  Recent Labs Lab 10/26/16 2026  BNP >4,500.0*     DDimer   Recent Labs Lab 10/26/16 2026  DDIMER 1.36*     Radiology    Ct Abdomen Pelvis Wo Contrast  Result Date: 10/28/2016 CLINICAL DATA:  46 year old female with multiple organ failure in the setting of hypertensive emergency an acute renal failure. EXAM: CT ABDOMEN AND PELVIS WITHOUT CONTRAST TECHNIQUE: Multidetector CT imaging of the abdomen and pelvis was performed following the standard protocol without IV contrast. COMPARISON:  None. FINDINGS: Lower chest: The visualized lower lungs are clear. Marked cardiomegaly with left ventricular dilatation. No pericardial effusion. Unremarkable visualized thoracic esophagus. Hepatobiliary: No focal liver abnormality is seen. Status post cholecystectomy. No biliary dilatation. Pancreas: Unremarkable. No pancreatic ductal dilatation or surrounding inflammatory changes. Spleen: Normal in size without focal abnormality. Adrenals/Urinary Tract: Unremarkable adrenal glands. The kidneys are relatively small bilaterally. Left extrarenal pelvis with slight dilatation.  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 No nephrolithiasis. The bladder is decompressed. Stomach/Bowel: No evidence of obstruction or focal bowel wall thickening. Normal appendix in the right lower quadrant. The terminal ileum is unremarkable. Vascular/Lymphatic: Limited evaluation in the absence of intravenous contrast. No aneurysm. Mild atherosclerotic vascular calcifications along the abdominal aorta. No suspicious lymphadenopathy. Reproductive: Uterus and bilateral adnexa are unremarkable. Other: Trace free fluid in the pelvic cul-de-sac may be physiologic. Musculoskeletal: No acute fracture or aggressive appearing lytic or blastic osseous lesion. L4-L5 and L5-S1 facet arthropathy on the right. IMPRESSION: 1. No acute abnormality in the abdomen or pelvis. 2. Marked cardiomegaly with left ventricular dilatation. 3. Surgical changes of prior cholecystectomy. 4. Dilated left extrarenal pelvis. 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 5.  Aortic Atherosclerosis (ICD10-170.0) 6. Right lower lumbar facet arthropathy. Electronically Signed   By: Jacqulynn Cadet M.D.   On: 10/28/2016 14:41    Cardiac Studies   TTE: 10/27/16 - Left ventricle: The cavity size was normal. Wall thickness was   increased in a pattern of moderate LVH. Systolic function was   severely reduced. The estimated ejection fraction was in the   range of 20% to 25%. Diffuse hypokinesis. Features are consistent   with a pseudonormal left ventricular filling pattern, with   concomitant abnormal relaxation and increased filling pressure   (grade 2 diastolic dysfunction). Doppler parameters are   consistent with high ventricular filling pressure. - Regional wall motion abnormality: Akinesis of the mid   anteroseptal and mid inferoseptal myocardium; severe hypokinesis   of the mid anterior and mid inferior myocardium. - Mitral valve: There was mild regurgitation. - Left atrium: The atrium was severely dilated. - Right ventricle:  Systolic function was moderately to severely   reduced. - Systemic veins: Dilated IVC with normal respiratory variation.   Estimated CVP 8 mmHg.    Patient Profile     46 y.o. female   [place]    1. Hypertensive emergency with CHF and renal failure The patient presents with markedly elevated BP, now normal.  Medical non adherence is likely the exacerbating factor.    2. Acute systolic dysfunction Biventricular failure Almost euvolemic now Hold diuretics, replace potassium On metoprolol and hydralazine, imdur 2 gram sodium diet Strict Is and Os Likely due to hypertensive disease.  Not currently a  candidate for cath/ invasive procedures.  Will workup etiology of her heart failure further as she recovers.  3. Elevated troponin Demand ischemia  Not consistent with acute thrombotic MI  4. Acute renal failure Appreciated nephrology input Ultrasound reveals cortical renal disease  The patient is stable for a discharge, we will arrange for an outpatient follow up. I would hold diuretics, she is advised to call us if her weight goes up by 3 lbs.   For questions or updates, please contact [place] Please consult [email or web address] for contact info under Cardiology/STEMI. Daytime calls, contact the Day Call APP (6a-8a) or assigned team (Teams A-D) provider (7:30a - 5p). All other daytime calls (7:30-5p), contact the Card Master @ [phone].   Nighttime calls, contact the assigned APP (5p-8p) or MD (6:30p-8p). Overnight calls (8p-6a), contact the on call Fellow @ [phone].   Signed, Ena Dawley, MD  10/30/2016, 9:54 AM

## 2016-10-30 NOTE — Plan of Care (Signed)
Problem: Education: Goal: Knowledge of [facility] Education information/materials will improve Outcome: Progressing POC reviewed with pt.   

## 2016-10-30 NOTE — Care Management Note (Signed)
Case Management Note  Patient Details  Name: Mallory Collier MRN: [id number] Date of Birth: Jun 04, 1970  Subjective/Objective:          Pt admitted with CHF            Action/Plan:   PTA independent from home with husband.  Pt has appt with [facility] and denied barriers to affording prescribed medications.  Pt denied hardship as barriers to obtaining medication. NO CM Needs identified prior to discharge   Expected Discharge Date:  10/30/16               Expected Discharge Plan:  Home/Self Care  In-House Referral:     Discharge planning Services  CM Consult  Post Acute Care Choice:    Choice offered to:     DME Arranged:    DME Agency:     HH Arranged:    [place] Agency:     Status of Service:  Completed, signed off  If discussed at [employer] of Stay Meetings, dates discussed:    Additional Comments:  Maryclare Labrador, RN 10/30/2016, 11:00 AM

## 2016-10-30 NOTE — Discharge Instructions (Signed)
Chronic Kidney Disease, Adult Chronic kidney disease (CKD) occurs when the kidneys are damaged during a period of 3 or more months. The kidneys are two organs that do many important jobs in the body, which include:  Removing wastes and extra fluids from the blood.  Making hormones that maintain the amount of fluid in your tissues and blood vessels.  Maintaining the right amount of fluids and chemicals in the body.  A small amount of kidney damage may not cause problems, but a large amount of damage may make it difficult or impossible for the kidneys to work the way they should. If steps are not taken to slow down the kidney damage or stop it from getting worse, the kidneys may stop working permanently (end stage kidney disease). Most of the time, CKD does not go away, but it can often be controlled. People who have CKD can usually live normal lives. What are the causes? The most common causes of this condition are diabetes and high blood pressure (hypertension). Other causes include:  Heart and blood vessel (cardiovascular) disease.  Kidney diseases: ? Glomerulonephritis. ? Interstitial nephritis. ? Polycystic kidney disease. ? Renal vascular disease.  Diseases that affect the immune system.  Genetic diseases.  Medicines that damage the kidneys, such as anti-inflammatory medicines.  Poisoning.  Being around or in contact with poisonous (toxic) substances.  A kidney or urinary infection that occurs again (recurs).  Vasculitis.  Repeat kidney infections.  A problem with urine flow that may be caused by: ? Cancer. ? Having kidney stones more than one time. ? An enlarged prostate in males.  What increases the risk? This condition is more likely to develop in people who are:  Older than age 48.  Female.  Of African-American descent.  Current smokers or former smokers.  Obese.  You may also have an increased risk for CKD if you have a family history of CKDor  youfrequently take medicines that are damaging to the kidneys. What are the signs or symptoms? Symptoms develop slowly and may not be obvious until the kidney damage becomes severe. It is possible to have a kidney disease for years without showing any symptoms. Symptoms of this condition can include:  Swelling (edema) of the face, legs, ankles, or feet.  Numbness, tingling, or loss of feeling (sensation) in the hands or feet.  Tiredness (lethargy).  Nausea or vomiting.  Confusion or trouble concentrating.  Problems with urination, such as: ? Painful or burning feeling during urination. ? Decreased urine production. ? Frequent urination, especially at night. ? Bloody urine.  Muscle twitches and cramps, especially in the legs.  Shortness of breath.  Weakness.  Constant itchiness.  Loss of appetite.  Metallic taste in the mouth.  Trouble sleeping.  Pale lining of the eyelids and surface of the eye (conjunctiva).  How is this diagnosed? This condition may be diagnosed with various tests. Tests may include:  Blood tests.  Urine tests.  Imaging tests.  A test in which a sample of tissue is removed from the kidneys to be looked at under a microscope (kidney biopsy).  These test results will help your health care provider determine what class of CKD you have. How is this treated? Most cases of CKD cannot be cured. Treatment usually involves relieving symptoms and preventing or slowing the progression of the disease. Treatment may include:  A special diet, which may require you to avoid alcohol, salty foods (sodium), and foods that are high in potassium, calcium, and protein.  Medicines: ? To lower blood pressure. ? To relieve low blood count (anemia). ? To relieve swelling. ? To protect your bones. ? To improve the balance of electrolytes in your blood.  Removing toxic waste from the body by using hemodialysis or peritoneal dialysis if the kidneys can no longer do  their job (kidney failure).  Management of other conditions that are causing your CKD or making it worse.  Follow these instructions at home:  Follow your prescribed diet.  Take over-the-counter and prescription medicines only as told by your health care provider. ? Do not take any new medicines unless approved by your health care provider. Many medicines can worsen your kidney damage. ? Do not take any vitamin and mineral supplements unless approved by your health care provider. Many nutritional supplements can worsen your kidney damage. ? The dose of some medicines that you take may need to be adjusted.  Do not use any tobacco products, such as cigarettes, chewing tobacco, and e-cigarettes. If you need help quitting, ask your health care provider.  Keep all follow-up visits as told by your health care provider. This is important.  Keep track of your blood pressure. Report changes in your blood pressure as told by your health care provider.  Achieve and maintain a healthy weight. If you need help with this, ask your health care provider.  Start or continue an exercise plan. Try to exercise at least 30 minutes a day, 5 days a week.  Stay current with immunizations as told by your health care provider. Where to find more information:  American Association of Kidney Patients: BombTimer.gl  National Kidney Foundation: www.kidney.[place]: https://mathis.com/  Life Options [facility]: www.lifeoptions.org and www.kidneyschool.org Contact a health care provider if:  Your symptoms get worse.  You develop new symptoms. Get help right away if:  You develop symptoms of end-stage kidney disease, which include: ? Headaches. ? Abnormally dark or light skin. ? Numbness in the hands or feet. ? Easy bruising. ? Frequent hiccups. ? Chest pain. ? Shortness of breath. ? End of menstruation in women.  You have a fever.  You have decreased urine  production.  You have pain or bleeding when you urinate. This information is not intended to replace advice given to you by your health care provider. Make sure you discuss any questions you have with your health care provider. Document Released: 11/15/2007 Document Revised: 07/14/2015 Document Reviewed: 10/05/2011 Elsevier Interactive Patient Education  2017 Elsevier Inc.   Preventing Hypertension Hypertension, commonly called high blood pressure, is when the force of blood pumping through the arteries is too strong. Arteries are blood vessels that carry blood from the heart throughout the body. Over time, hypertension can damage the arteries and decrease blood flow to important parts of the body, including the brain, heart, and kidneys. Often, hypertension does not cause symptoms until blood pressure is very high. For this reason, it is important to have your blood pressure checked on a regular basis. Hypertension can often be prevented with diet and lifestyle changes. If you already have hypertension, you can control it with diet and lifestyle changes, as well as medicine. What nutrition changes can be made? Maintain a healthy diet. This includes:  Eating less salt (sodium). Ask your health care provider how much sodium is safe for you to have. The general recommendation is to consume less than 1 tsp (2,300 mg) of sodium a day. ? Do not add salt to your food. ? Choose low-sodium options  when grocery shopping and eating out.  Limiting fats in your diet. You can do this by eating low-fat or fat-free dairy products and by eating less red meat.  Eating more fruits, vegetables, and whole grains. Make a goal to eat: ? 1-2 cups of fresh fruits and vegetables each day. ? 3-4 servings of whole grains each day.  Avoiding foods and beverages that have added sugars.  Eating fish that contain healthy fats (omega-3 fatty acids), such as mackerel or salmon.  If you need help putting together a  healthy eating plan, try the DASH diet. This diet is high in fruits, vegetables, and whole grains. It is low in sodium, red meat, and added sugars. DASH stands for Dietary Approaches to Stop Hypertension. What lifestyle changes can be made?  Lose weight if you are overweight. Losing just 3?5% of your body weight can help prevent or control hypertension. ? For example, if your present weight is 200 lb (91 kg), a loss of 3-5% of your weight means losing 6-10 lb (2.7-4.5 kg). ? Ask your health care provider to help you with a diet and exercise plan to safely lose weight.  Get enough exercise. Do at least 150 minutes of moderate-intensity exercise each week. ? You could do this in short exercise sessions several times a day, or you could do longer exercise sessions a few times a week. For example, you could take a brisk 10-minute walk or bike ride, 3 times a day, for 5 days a week.  Find ways to reduce stress, such as exercising, meditating, listening to music, or taking a yoga class. If you need help reducing stress, ask your health care provider.  Do not smoke. This includes e-cigarettes. Chemicals in tobacco and nicotine products raise your blood pressure each time you smoke. If you need help quitting, ask your health care provider.  Avoid alcohol. If you drink alcohol, limit alcohol intake to no more than 1 drink a day for nonpregnant women and 2 drinks a day for men. One drink equals 12 oz of beer, 5 oz of wine, or 1 oz of hard liquor. Why are these changes important? Diet and lifestyle changes can help you prevent hypertension, and they may make you feel better overall and improve your quality of life. If you have hypertension, making these changes will help you control it and help prevent major complications, such as:  Hardening and narrowing of arteries that supply blood to: ? Your heart. This can cause a heart attack. ? Your brain. This can cause a stroke. ? Your kidneys. This can cause  kidney failure.  Stress on your heart muscle, which can cause heart failure.  What can I do to lower my risk?  Work with your health care provider to make a hypertension prevention plan that works for you. Follow your plan and keep all follow-up visits as told by your health care provider.  Learn how to check your blood pressure at home. Make sure that you know your personal target blood pressure, as told by your health care provider. How is this treated? In addition to diet and lifestyle changes, your health care provider may recommend medicines to help lower your blood pressure. You may need to try a few different medicines to find what works best for you. You also may need to take more than one medicine. Take over-the-counter and prescription medicines only as told by your health care provider. Where to find support: Your health care provider can help you prevent  hypertension and help you keep your blood pressure at a healthy level. Your local hospital or your community may also provide support services and prevention programs. The American Heart Association offers an online support network at: CheapBootlegs.com.cy Where to find more information: Learn more about hypertension from:  National Heart, Lung, and Blood Institute: ElectronicHangman.is  Centers for Disease Control and Prevention: https://ingram.com/  American Academy of Family Physicians: http://familydoctor.org/familydoctor/en/diseases-conditions/high-blood-pressure.printerview.all.html  Learn more about the DASH diet from:  [place], Lung, and [place]: https://www.reyes.com/  Contact a health care provider if:  You think you are having a reaction to medicines you have taken.  You have recurrent headaches or feel dizzy.  You have swelling in your ankles.  You have trouble with your  vision. Summary  Hypertension often does not cause any symptoms until blood pressure is very high. It is important to get your blood pressure checked regularly.  Diet and lifestyle changes are the most important steps in preventing hypertension.  By keeping your blood pressure in a healthy range, you can prevent complications like heart attack, heart failure, stroke, and kidney failure.  Work with your health care provider to make a hypertension prevention plan that works for you. This information is not intended to replace advice given to you by your health care provider. Make sure you discuss any questions you have with your health care provider. Document Released: 02/20/2015 Document Revised: 10/17/2015 Document Reviewed: 10/17/2015 Elsevier Interactive Patient Education  2017 [employer].

## 2016-11-06 ENCOUNTER — Ambulatory Visit: Payer: 59 | Admitting: Physician Assistant

## 2016-11-06 DIAGNOSIS — I429 Cardiomyopathy, unspecified: Secondary | ICD-10-CM | POA: Insufficient documentation

## 2016-11-06 HISTORY — DX: Cardiomyopathy, unspecified: I42.9

## 2016-11-06 NOTE — Progress Notes (Deleted)
Cardiology Office Note    Date:  11/06/2016   ID:  Mallory Collier, DOB Mar 24, 1970, MRN [id number]  PCP:  Patient, No Pcp Per  Cardiologist: Dr. Rayann Heman  No chief complaint on file.   History of Present Illness:  Mallory Collier is a 46 y.o. female with history of hypertension, diabetes mellitus, medical nonadherence who was seen in consult in the hospital 10/28/16 with hypertensive emergency, renal failure and CHF. She says she was told she had CHF back in 2011 and was placed on medicine but hadn't been compliant. 2-D echo showed severe biventricular failure EF 20% with moderate LVH and mild MR, severe left atrial enlargement. She was diuresed but this was limited because of renal failure. Imdur was added for afterload reduction. LV dysfunction felt secondary to hypertensive disease. Patient was not a candidate for catheter invasive procedures at that time with multiorgan failure in the setting of hypertensive emergency.    Past Medical History:  Diagnosis Date  . Diabetes mellitus without complication ([place])   . Hypertension   . Noncompliance with medication regimen     Past Surgical History:  Procedure Laterality Date  . CHOLECYSTECTOMY    . OVARIAN CYST SURGERY      Current Medications: No outpatient prescriptions have been marked as taking for the 11/06/16 encounter (Appointment) with Imogene Burn, PA-C.     Allergies:   Patient has no known allergies.   Social History   Social History  . Marital status: Married    Spouse name: N/A  . Number of children: N/A  . Years of education: N/A   Social History Main Topics  . Smoking status: Never Smoker  . Smokeless tobacco: Never Used  . Alcohol use No  . Drug use: No  . Sexual activity: Not on file   Other Topics Concern  . Not on file   Social History Narrative  . No narrative on file     Family History:  The patient's ***family history includes Congestive Heart Failure in her father.   ROS:   Please  see the history of present illness.    ROS All other systems reviewed and are negative.   PHYSICAL EXAM:   VS:  LMP 10/12/2016   Physical Exam  GEN: Well nourished, well developed, in no acute distress  HEENT: normal  Neck: no JVD, carotid bruits, or masses Cardiac:RRR; no murmurs, rubs, or gallops  Respiratory:  clear to auscultation bilaterally, normal work of breathing GI: soft, nontender, nondistended, + BS Ext: without cyanosis, clubbing, or edema, Good distal pulses bilaterally MS: no deformity or atrophy  Skin: warm and dry, no rash Neuro:  Alert and Oriented x 3, Strength and sensation are intact Psych: euthymic mood, full affect  Wt Readings from Last 3 Encounters:  10/30/16 169 lb 5 oz (76.8 kg)      Studies/Labs Reviewed:   EKG:  EKG is*** ordered today.  The ekg ordered today demonstrates ***  Recent Labs: 10/26/2016: B Natriuretic Peptide >4,500.0; TSH 1.901 10/27/2016: Hemoglobin 9.5; Platelets 260 10/28/2016: Magnesium 2.6 10/29/2016: ALT 18 10/30/2016: BUN 54; Creatinine, Ser 6.47; Potassium 3.3; Sodium 135   Lipid Panel No results found for: CHOL, TRIG, HDL, CHOLHDL, VLDL, LDLCALC, LDLDIRECT  Additional studies/ records that were reviewed today include:    TTE: 10/27/16 - Left ventricle: The cavity size was normal. Wall thickness was   increased in a pattern of moderate LVH. Systolic function was   severely reduced. The estimated ejection fraction was in the  range of 20% to 25%. Diffuse hypokinesis. Features are consistent   with a pseudonormal left ventricular filling pattern, with   concomitant abnormal relaxation and increased filling pressure   (grade 2 diastolic dysfunction). Doppler parameters are   consistent with high ventricular filling pressure. - Regional wall motion abnormality: Akinesis of the mid   anteroseptal and mid inferoseptal myocardium; severe hypokinesis   of the mid anterior and mid inferior myocardium. - Mitral valve: There was  mild regurgitation. - Left atrium: The atrium was severely dilated. - Right ventricle: Systolic function was moderately to severely   reduced. - Systemic veins: Dilated IVC with normal respiratory variation.   Estimated CVP 8 mmHg.       ASSESSMENT:    1. Hypertensive emergency   2. Cardiomyopathy, unspecified type ([place])   3. Biventricular congestive heart failure ([place])   4. Acute renal failure superimposed on stage 5 chronic kidney disease, not on chronic dialysis, unspecified acute renal failure type ([place])      PLAN:  In order of problems listed above:   Hypertensive emergency with recent hospitalization  Cardiomyopathy LVEF 20% with biventricular CHF most likely secondary to uncontrolled hypertension  Acute renal failure post on stage V chronic kidney disease discharge creatinine 6.47     Medication Adjustments/Labs and Tests Ordered: Current medicines are reviewed at length with the patient today.  Concerns regarding medicines are outlined above.  Medication changes, Labs and Tests ordered today are listed in the Patient Instructions below. There are no Patient Instructions on file for this visit.   Sumner Boast, PA-C  11/06/2016 8:42 AM    [facility] [address], [place], [place]  [address] Phone: [phone]; Fax: [phone]

## 2016-11-09 ENCOUNTER — Inpatient Hospital Stay: Payer: 59

## 2016-11-20 ENCOUNTER — Telehealth: Payer: Self-pay

## 2016-11-20 ENCOUNTER — Ambulatory Visit: Payer: 59 | Admitting: Cardiology

## 2016-11-20 NOTE — Telephone Encounter (Signed)
Left message for patient to call back. Informed patient on message that she has enough refills until 11/27/16. Patient's next appointment is with Robbie Lis PA on 12/06/16. Patient can call and get a 10 day refill to cover her until her appt on 12/06/16.

## 2016-11-23 ENCOUNTER — Other Ambulatory Visit: Payer: Self-pay

## 2016-11-23 DIAGNOSIS — N185 Chronic kidney disease, stage 5: Secondary | ICD-10-CM

## 2016-11-28 ENCOUNTER — Other Ambulatory Visit: Payer: Self-pay | Admitting: Physician Assistant

## 2016-11-28 MED ORDER — HYDRALAZINE HCL 50 MG PO TABS: 50.0000 mg | ORAL_TABLET | Freq: Three times a day (TID) | ORAL | 0 refills | Status: DC

## 2016-11-28 MED ORDER — ISOSORBIDE MONONITRATE ER 30 MG PO TB24: 30.0000 mg | ORAL_TABLET | Freq: Every day | ORAL | 0 refills | Status: DC

## 2016-11-28 MED ORDER — AMLODIPINE BESYLATE 10 MG PO TABS: 10.0000 mg | ORAL_TABLET | Freq: Every day | ORAL | 0 refills | Status: DC

## 2016-11-28 NOTE — Telephone Encounter (Signed)
Per phone note on 11/20/16, Pam Pate-Ingalls, RN, stated that pt could get a 10 day supply, enough until pt comes in to see provider on 12/06/16. Pt's medication was sent to pt's pharmacy as requested. Confirmation received.

## 2016-11-30 NOTE — Progress Notes (Signed)
Cardiology Office Note    Date:  12/03/2016   ID:  Mallory Collier, DOB [birth year], MRN [id number]  PCP:  Patient, No Pcp Per  Cardiologist:  Dr. Meda Coffee  Chief Complaint: Hospital follow up for HTN and CHF  History of Present Illness:   Mallory Collier is a 46 y.o. female with a h/o HTN, DM, CKD stage 5 and medical nonadherence who is recently admitted with hypertensive emergency with renal failure and CHF presents for follow up. Echo showed LVEF of 20-25% and RV failure with grade 2 DD. Likely due to hypertensive disease due to medication non compliance.Not currently a candidate for cath/ invasive procedures. Renal ultrasound reveals cortical renal disease. Plan to follow up with Dr. Justin Mend as outpatient. Elevated troponin  felt demand. Also treated with abx for UTI. Referred to urology for ureteropelvic junction stenosis as outpatient.   Here today for follow up.  Patient was seen by Dr. Justin Mend Discontinued metoprolol and potassium , started on labetalol 100 mg 2 times a day. Her blood pressure runs in 150 over 80s at home. She denies any orthopnea, PND, syncope, melena,  Chest pain, shortness of breath.  Follows low sodium diet intermittently and associated LE edema.    Past Medical History:  Diagnosis Date  . AKI (acute kidney injury) ([place]) 10/27/2016  . Cardiomyopathy ([place]) 11/06/2016  . CHF (congestive heart failure) ([place]) 10/27/2016  . Diabetes mellitus without complication ([place])   . Elevated troponin 10/27/2016  . Hypertension   . Hypertensive emergency 10/27/2016  . Microcytic hypochromic anemia 10/27/2016  . Noncompliance with medication regimen   . Stenosis of ureteropelvic junction (UPJ)   . Systolic and diastolic CHF, acute ([place])   . Type 2 diabetes mellitus with renal manifestations ([place]) 10/27/2016    Past Surgical History:  Procedure Laterality Date  . CHOLECYSTECTOMY    . OVARIAN CYST SURGERY      Current Medications: Prior to Admission medications   Medication Sig  Start Date End Date Taking? Authorizing Provider  amLODipine (NORVASC) 10 MG tablet Take 1 tablet (10 mg total) by mouth daily. 11/28/16   Leanor Kail, PA  ferrous sulfate 325 (65 FE) MG tablet Take 1 tablet (325 mg total) by mouth 2 (two) times daily with a meal. 10/30/16   Allie Bossier, MD  hydrALAZINE (APRESOLINE) 50 MG tablet Take 1 tablet (50 mg total) by mouth every 8 (eight) hours. 11/28/16   Lux Skilton, Crista Luria, PA  isosorbide mononitrate (IMDUR) 30 MG 24 hr tablet Take 1 tablet (30 mg total) by mouth daily. 11/28/16   Bryer Cozzolino, Crista Luria, PA  metoprolol tartrate (LOPRESSOR) 100 MG tablet Take 1 tablet (100 mg total) by mouth 2 (two) times daily. 10/30/16   Allie Bossier, MD  potassium chloride 20 MEQ TBCR Take 20 mEq by mouth daily. 10/31/16   Allie Bossier, MD  vitamin C (ASCORBIC ACID) 250 MG tablet Take 1 tablet (250 mg total) by mouth 2 (two) times daily. 10/30/16   Allie Bossier, MD    Allergies:   Patient has no known allergies.   Social History   Social History  . Marital status: Married    Spouse name: N/A  . Number of children: N/A  . Years of education: N/A   Social History Main Topics  . Smoking status: Never Smoker  . Smokeless tobacco: Never Used  . Alcohol use No  . Drug use: No  . Sexual activity: Not Asked   Other Topics Concern  . None  Social History Narrative  . None     Family History:  The patient's family history includes Congestive Heart Failure in her father.   ROS:   Please see the history of present illness.    ROS All other systems reviewed and are negative.   PHYSICAL EXAM:   VS:  BP (!) 162/64   Pulse 81   Ht 5\' 5"  (1.651 m)   Wt 168 lb (76.2 kg)   BMI 27.96 kg/m    GEN: Well nourished, well developed, in no acute distress  HEENT: normal  Neck: no JVD, carotid bruits, or masses Cardiac: RRR; no murmurs, rubs, or gallops,no edema  Respiratory:  clear to auscultation bilaterally, normal work of breathing GI: soft,  nontender, nondistended, + BS MS: no deformity or atrophy  Skin: warm and dry, no rash Neuro:  Alert and Oriented x 3, Strength and sensation are intact Psych: euthymic mood, full affect  Wt Readings from Last 3 Encounters:  12/03/16 168 lb (76.2 kg)  10/30/16 169 lb 5 oz (76.8 kg)      Studies/Labs Reviewed:   EKG:  EKG is ordered today.  The ekg ordered today demonstrates  Sinus rhythm at rate of 81 bpm, PVC, LVH  Recent Labs: 10/26/2016: B Natriuretic Peptide >4,500.0; TSH 1.901 10/27/2016: Hemoglobin 9.5; Platelets 260 10/28/2016: Magnesium 2.6 10/29/2016: ALT 18 10/30/2016: BUN 54; Creatinine, Ser 6.47; Potassium 3.3; Sodium 135   Lipid Panel No results found for: CHOL, TRIG, HDL, CHOLHDL, VLDL, LDLCALC, LDLDIRECT  Additional studies/ records that were reviewed today include:   TTE: 10/27/16 - Left ventricle: The cavity size was normal. Wall thickness was increased in a pattern of moderate LVH. Systolic function was severely reduced. The estimated ejection fraction was in the range of 20% to 25%. Diffuse hypokinesis. Features are consistent with a pseudonormal left ventricular filling pattern, with concomitant abnormal relaxation and increased filling pressure (grade 2 diastolic dysfunction). Doppler parameters are consistent with high ventricular filling pressure. - Regional wall motion abnormality: Akinesis of the mid anteroseptal and mid inferoseptal myocardium; severe hypokinesis of the mid anterior and mid inferior myocardium. - Mitral valve: There was mild regurgitation. - Left atrium: The atrium was severely dilated. - Right ventricle: Systolic function was moderately to severely reduced. - Systemic veins: Dilated IVC with normal respiratory variation. Estimated CVP 8 mmHg.   ASSESSMENT & PLAN:    1. Hypertensive heart disease with CHF and CKD -  Blood pressure is elevated today. Will increase labetalol to 200mg  BID.   Continue Norvasc 10  mg,  Hydralazine 50 mg 3 times a day and Imur30 mg daily.F ollow-up in hypertension clinic in 2 weeks..  2. Chronic combined CHF - Echo with biventricular failure. Not a candidate for invasive procedure.  Euvolemic. Advice to compliance with low-sodium diet Will review with Dr. Meda Coffee if requires stress test or not. No chest pain or dyspnea.  - Continue BB, Hydralazine and Imdur. Not on ACE/ARB due to CKD.   3. CKD stage IV - Followed by Dr. Justin Mend.  No current plan for dialysis. May place  Excess in near future.   4. Anemia - Per PCP  5. DM - A1c 6.3. Controlled. Per PCP   Medication Adjustments/Labs and Tests Ordered: Current medicines are reviewed at length with the patient today.  Concerns regarding medicines are outlined above.  Medication changes, Labs and Tests ordered today are listed in the Patient Instructions below. Patient Instructions  Medication Instructions: Your physician has recommended you make the following  change in your medication:  -1) INCREASE Labetolol 200 MG - Take 1 tablet (200 mg) by mouth twice daily - NEW RX SENT TO Pharmacy **you may continue to take 2 - 100 mg tablets twice daily until you run out of you current prescription**  Labwork: None Ordered  Procedures/Testing: None Ordered  Follow-Up: Your physician recommends that you schedule a follow-up appointment in: 10-14 days with Hypertension Clinic with your blood pressure cuff  Your physician recommends that you schedule a follow-up appointment in: 2-3 months with Dr. Meda Coffee   If you need a refill on your cardiac medications before your next appointment, please call your pharmacy.      Jarrett Soho, [place]  12/03/2016 11:35 AM    [facility] [address], [place], [place]  [address] Phone: [phone]; Fax: [phone]

## 2016-12-03 ENCOUNTER — Encounter (INDEPENDENT_AMBULATORY_CARE_PROVIDER_SITE_OTHER): Payer: Self-pay

## 2016-12-03 ENCOUNTER — Encounter: Payer: Self-pay | Admitting: Physician Assistant

## 2016-12-03 ENCOUNTER — Ambulatory Visit (INDEPENDENT_AMBULATORY_CARE_PROVIDER_SITE_OTHER): Payer: 59 | Admitting: Physician Assistant

## 2016-12-03 VITALS — BP 162/64 | HR 81 | Ht 65.0 in | Wt 168.0 lb

## 2016-12-03 DIAGNOSIS — I5082 Biventricular heart failure: Secondary | ICD-10-CM | POA: Diagnosis not present

## 2016-12-03 DIAGNOSIS — I130 Hypertensive heart and chronic kidney disease with heart failure and stage 1 through stage 4 chronic kidney disease, or unspecified chronic kidney disease: Secondary | ICD-10-CM | POA: Diagnosis not present

## 2016-12-03 DIAGNOSIS — I5042 Chronic combined systolic (congestive) and diastolic (congestive) heart failure: Secondary | ICD-10-CM | POA: Diagnosis not present

## 2016-12-03 MED ORDER — LABETALOL HCL 200 MG PO TABS: 200.0000 mg | ORAL_TABLET | Freq: Two times a day (BID) | ORAL | 1 refills | Status: DC

## 2016-12-03 NOTE — Patient Instructions (Addendum)
Medication Instructions: Your physician has recommended you make the following change in your medication:  -1) INCREASE Labetolol 200 MG - Take 1 tablet (200 mg) by mouth twice daily - NEW RX SENT TO Pharmacy **you may continue to take 2 - 100 mg tablets twice daily until you run out of you current prescription**  Labwork: None Ordered  Procedures/Testing: None Ordered  Follow-Up: Your physician recommends that you schedule a follow-up appointment in: 10-14 days with Hypertension Clinic with your blood pressure cuff  Your physician recommends that you schedule a follow-up appointment in: 2-3 months with Dr. Meda Coffee   If you need a refill on your cardiac medications before your next appointment, please call your pharmacy.

## 2016-12-06 ENCOUNTER — Ambulatory Visit: Payer: 59 | Admitting: Physician Assistant

## 2016-12-07 ENCOUNTER — Other Ambulatory Visit: Payer: Self-pay

## 2016-12-07 MED ORDER — AMLODIPINE BESYLATE 10 MG PO TABS: 10.0000 mg | ORAL_TABLET | Freq: Every day | ORAL | 3 refills | Status: DC

## 2016-12-07 MED ORDER — ISOSORBIDE MONONITRATE ER 30 MG PO TB24: 30.0000 mg | ORAL_TABLET | Freq: Every day | ORAL | 3 refills | Status: DC

## 2016-12-18 ENCOUNTER — Encounter: Payer: Self-pay | Admitting: Pharmacist

## 2016-12-18 ENCOUNTER — Ambulatory Visit (INDEPENDENT_AMBULATORY_CARE_PROVIDER_SITE_OTHER): Payer: 59 | Admitting: Pharmacist

## 2016-12-18 VITALS — BP 146/82 | HR 69

## 2016-12-18 DIAGNOSIS — I5022 Chronic systolic (congestive) heart failure: Secondary | ICD-10-CM | POA: Diagnosis not present

## 2016-12-18 DIAGNOSIS — I10 Essential (primary) hypertension: Secondary | ICD-10-CM

## 2016-12-18 MED ORDER — HYDRALAZINE HCL 100 MG PO TABS: 100.0000 mg | ORAL_TABLET | Freq: Three times a day (TID) | ORAL | 1 refills | Status: DC

## 2016-12-18 NOTE — Patient Instructions (Signed)
It was nice to meet you today  Your goal blood pressure is less than 130/80  Increase your hydralazine to 100mg  3x per day  Continue to take your other medications  We will check your blood pressure again in 2 weeks and plan to switch from labetalol to carvedilol at that visit

## 2016-12-18 NOTE — Progress Notes (Signed)
Patient ID: Mallory Collier                 DOB: 1970/08/01                      MRN: [id number]     HPI: Mallory Collier is a 46 y.o. female patient of Dr Meda Coffee referred by Robbie Lis, PA to HTN clinic. PMH is significant for HTN, DM, CKD stage 5 with medical nonadherence. She was recently admitted for [facility] emergency with renal failure and CHF with LVEF of 20-25% on echo. SCr > 6 however no current plans for dialysis. Her PCP recently switched her from metoprolol to labetalol. At last cardiology visit, BP was elevated at 162/64 and labetalol was increased to 200mg  BID. Pt presents today for follow up and further management.  Pt presents today in good spirits with her husband. She has been tolerating her dose increase of labetalol well. She used to check her BP but lost her cuff. She denies dizziness, blurred vision, falls, or headache. She occasionally gets shaky hands ~2 hours before she is due for her next hydralazine dose. She avoids salt and caffeine and is trying to increase her walking.  Current HTN meds: amlodipine 10mg  daily, hydralazine 50mg  TID, isosorbide 30mg  daily, labetalol 200mg  BID BP goal: <130/51mmHg  Family History: CHF in her father.  Social History: Denies tobacco, alcohol, or illicit drug use.  Diet: Does not add salt to food. Eats most meals at home. Likes grilled chicken, fruit, rice, vegetables. Uses Mrs Deliah Boston salt substitute. Does not drink any caffeine.  Exercise: Goal to walk every day.  Home BP readings: Lost her BP cuff.  Wt Readings from Last 3 Encounters:  12/03/16 168 lb (76.2 kg)  10/30/16 169 lb 5 oz (76.8 kg)   BP Readings from Last 3 Encounters:  12/03/16 (!) 162/64  10/30/16 125/75   Pulse Readings from Last 3 Encounters:  12/03/16 81  10/30/16 67    Renal function: CrCl cannot be calculated (Patient's most recent lab result is older than the maximum 21 days allowed.).  Past Medical History:  Diagnosis Date  . AKI (acute kidney  injury) ([place]) 10/27/2016  . Cardiomyopathy ([place]) 11/06/2016  . CHF (congestive heart failure) ([place]) 10/27/2016  . Diabetes mellitus without complication ([place])   . Elevated troponin 10/27/2016  . Hypertension   . Hypertensive emergency 10/27/2016  . Microcytic hypochromic anemia 10/27/2016  . Noncompliance with medication regimen   . Stenosis of ureteropelvic junction (UPJ)   . Systolic and diastolic CHF, acute ([place])   . Type 2 diabetes mellitus with renal manifestations ([place]) 10/27/2016    Current Outpatient Prescriptions on File Prior to Visit  Medication Sig Dispense Refill  . amLODipine (NORVASC) 10 MG tablet Take 1 tablet (10 mg total) by mouth daily. 90 tablet 3  . ferrous sulfate 325 (65 FE) MG tablet Take 1 tablet (325 mg total) by mouth 2 (two) times daily with a meal. 60 tablet 0  . hydrALAZINE (APRESOLINE) 50 MG tablet Take 1 tablet (50 mg total) by mouth every 8 (eight) hours. 30 tablet 0  . isosorbide mononitrate (IMDUR) 30 MG 24 hr tablet Take 1 tablet (30 mg total) by mouth daily. 90 tablet 3  . labetalol (NORMODYNE) 200 MG tablet Take 1 tablet (200 mg total) by mouth 2 (two) times daily. 90 tablet 1  . vitamin C (ASCORBIC ACID) 250 MG tablet Take 1 tablet (250 mg total) by mouth 2 (two) times  daily. 60 tablet 0   No current facility-administered medications on file prior to visit.     No Known Allergies   Assessment/Plan:  1. Hypertension/HF medication optimization - BP is improved but remains above goal <130/58mmHg. Will switch labetalol to carvedilol due to data in HFrEF, however pt still has 2 weeks remaining on her labetalol rx while she is almost out of her hydralazine. Will increase hydralazine to 100mg  TID today and f/u in pharmacy clinic in 2 weeks with plans to switch labetalol to carvedilol at that time. Unable to start ACEi or spironolactone due to SCr > 6 with patient not on dialysis.  F/u in 2 weeks.  Megan E. Supple, PharmD, CPP, [facility] [address], [place], [place] [address] Phone: [phone]; Fax: [phone] 12/18/2016 4:02 PM

## 2016-12-31 NOTE — Progress Notes (Signed)
Patient ID: Mallory Collier                 DOB: 1970-12-24                      MRN: [id number]     HPI: Mallory Collier is a 46 y.o. female referred by Dr. Meda Coffee referred by Robbie Lis, PA to HTN clinic. PMH is significant for HFrEF (EF 20% 10/27/16), HTN, DM, CKD stage 5 with medical nonadherence. She was recently admitted for [facility] emergency with renal failure and CHF with LVEF of 20-25% on echo. SCr > 6 however no current plans for dialysis. Her PCP recently switched her from metoprolol to labetalol. At last cardiology visit, BP was elevated at 162/64 and labetalol was increased to 200mg  BID. At initial HTN clinic visit 12/18/16 hydralazine was increased to 100mg  TID and plans were made to switch labetalol to carvedilol at next appointment as pt had just picked up new labetalol prescription.  Pt presents today in good spirits with daughter. Pt denies falls, dizziness, blurry vision, and HA. Pt reports she will see nephrologist Dr. Justin Mend tomorrow who recently added KCl. Pt still does not check her BP at home and has not begun walking, but has remained compliant with a low Na diet.   Current HTN meds: amlodipine 10mg  daily, hydralazine 100mg  TID, isosorbide mononitrate 30mg  daily, labetalol 200mg  BID  BP goal: <130/51mmHg  Family History: CHF in her father.  Social History: Denies tobacco, alcohol, or illicit drug use.  Diet: Does not add salt to food. Eats most meals at home. Likes grilled chicken, fruit, rice, vegetables. Uses Mrs Deliah Boston salt substitute. Does not drink any caffeine.  Exercise: Goal to walk every day.   Home BP readings: Lost her BP cuff  Wt Readings from Last 3 Encounters:  12/03/16 168 lb (76.2 kg)  10/30/16 169 lb 5 oz (76.8 kg)   BP Readings from Last 3 Encounters:  12/18/16 (!) 146/82  12/03/16 (!) 162/64  10/30/16 125/75   Pulse Readings from Last 3 Encounters:  12/18/16 69  12/03/16 81  10/30/16 67    Renal function: CrCl cannot be  calculated (Patient's most recent lab result is older than the maximum 21 days allowed.).  Past Medical History:  Diagnosis Date  . AKI (acute kidney injury) ([place]) 10/27/2016  . Cardiomyopathy ([place]) 11/06/2016  . CHF (congestive heart failure) ([place]) 10/27/2016  . Diabetes mellitus without complication ([place])   . Elevated troponin 10/27/2016  . Hypertension   . Hypertensive emergency 10/27/2016  . Microcytic hypochromic anemia 10/27/2016  . Noncompliance with medication regimen   . Stenosis of ureteropelvic junction (UPJ)   . Systolic and diastolic CHF, acute ([place])   . Type 2 diabetes mellitus with renal manifestations ([place]) 10/27/2016    Current Outpatient Medications on File Prior to Visit  Medication Sig Dispense Refill  . amLODipine (NORVASC) 10 MG tablet Take 1 tablet (10 mg total) by mouth daily. 90 tablet 3  . ferrous sulfate 325 (65 FE) MG tablet Take 1 tablet (325 mg total) by mouth 2 (two) times daily with a meal. 60 tablet 0  . hydrALAZINE (APRESOLINE) 100 MG tablet Take 1 tablet (100 mg total) by mouth every 8 (eight) hours. 270 tablet 1  . isosorbide mononitrate (IMDUR) 30 MG 24 hr tablet Take 1 tablet (30 mg total) by mouth daily. 90 tablet 3  . labetalol (NORMODYNE) 200 MG tablet Take 1 tablet (200 mg total) by mouth 2 (  two) times daily. 90 tablet 1  . vitamin C (ASCORBIC ACID) 250 MG tablet Take 1 tablet (250 mg total) by mouth 2 (two) times daily. 60 tablet 0   No current facility-administered medications on file prior to visit.     No Known Allergies   Assessment/Plan:  1. Hypertension/Heart Failure - BP remains uncontrolled in clinic and above goal <130/80 mmHg. Will transition labetalol to carvedilol for improved BP-lowering effects and better data in HFrEF. Unable to add ACEi/ARB/ARNI or aldosterone antagonist at this time given advanced CKD. Will continue hydralazine 100mg  TID, Imdur 30mg  daily, and amlodipine 10mg  daily. Pt to F/U in clinic in 4 weeks. If BP remains  uncontrolled, could consider clonidine or minoxidil. Counseled pt on maintaining low-salt diet and encouraged to try to begin walking more regularly.  Arrie Senate, PharmD PGY-2 Cardiology Pharmacy Resident Pager: [phone] 01/01/2017

## 2017-01-01 ENCOUNTER — Ambulatory Visit (INDEPENDENT_AMBULATORY_CARE_PROVIDER_SITE_OTHER): Payer: 59 | Admitting: Pharmacist

## 2017-01-01 VITALS — BP 146/62 | HR 74

## 2017-01-01 DIAGNOSIS — I10 Essential (primary) hypertension: Secondary | ICD-10-CM | POA: Diagnosis not present

## 2017-01-01 MED ORDER — CARVEDILOL 25 MG PO TABS: 25.0000 mg | ORAL_TABLET | Freq: Two times a day (BID) | ORAL | 3 refills | Status: DC

## 2017-01-01 NOTE — Patient Instructions (Signed)
It was great to see you today!  Continue taking your amlodipine 10mg  daily, hydralazine 100mg  three times daily, and isosorbide mononitrate 30mg  daily.  Continue taking labetalol 200mg  twice daily until you run out, then begin taking carvedilol 25mg  twice daily.  Follow-up in clinic in 1 month.

## 2017-01-02 ENCOUNTER — Ambulatory Visit (HOSPITAL_COMMUNITY)
Admission: RE | Admit: 2017-01-02 | Discharge: 2017-01-02 | Disposition: A | Discharge status: Home / Self Care | Payer: 59 | Source: Ambulatory Visit | Attending: Vascular Surgery | Admitting: Vascular Surgery

## 2017-01-02 ENCOUNTER — Ambulatory Visit (INDEPENDENT_AMBULATORY_CARE_PROVIDER_SITE_OTHER): Payer: 59 | Admitting: Vascular Surgery

## 2017-01-02 ENCOUNTER — Encounter: Payer: Self-pay | Admitting: Vascular Surgery

## 2017-01-02 VITALS — BP 180/89 | HR 90 | Temp 97.3°F | Resp 16 | Ht 65.0 in | Wt 167.6 lb

## 2017-01-02 DIAGNOSIS — Z992 Dependence on renal dialysis: Secondary | ICD-10-CM

## 2017-01-02 DIAGNOSIS — N185 Chronic kidney disease, stage 5: Secondary | ICD-10-CM

## 2017-01-02 DIAGNOSIS — Z01810 Encounter for preprocedural cardiovascular examination: Secondary | ICD-10-CM | POA: Diagnosis present

## 2017-01-02 DIAGNOSIS — N186 End stage renal disease: Secondary | ICD-10-CM

## 2017-01-02 NOTE — Progress Notes (Signed)
Patient name: Mallory Collier MRN: [id number] DOB: 1970/08/04 Sex: female   REASON FOR CONSULT:    To evaluate for hemodialysis access.  The consult is requested by Dr. Edrick Oh.Mallory Collier  HPI:   Mallory Collier is a pleasant 46 y.o. 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  The patient is not yet on dialysis.  She is right-handed.  The patient denies any recent uremic symptoms.  Specifically, she denies nausea, vomiting, fatigue, anorexia, or palpitations.  I have reviewed the records that were sent from Dr. Jason Nest office.  The patient has stage V chronic kidney disease.  She was hospitalized in September.  She had hypertension which has been poorly controlled.  Past Medical History:  Diagnosis Date  . AKI (acute kidney injury) ([place]) 10/27/2016  . Cardiomyopathy ([place]) 11/06/2016  . CHF (congestive heart failure) ([place]) 10/27/2016  . Diabetes mellitus without complication ([place])   . Elevated troponin 10/27/2016  . Hypertension   . Hypertensive emergency 10/27/2016  . Microcytic hypochromic anemia 10/27/2016  . Noncompliance with medication regimen   . Stenosis of ureteropelvic junction (UPJ)   . Systolic and diastolic CHF, acute ([place])   . 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 Congestive Heart Failure Father     SOCIAL HISTORY: Social History   Socioeconomic History  . Marital status: Married    Spouse name: Not on file  . Number of children: Not on file  . Years of education: Not on file  . Highest education level: Not on file  Social Needs  . Financial resource strain: Not on file  . Food insecurity - worry: Not on file  . Food insecurity - inability: Not on file  . Transportation needs - medical: Not on file  . Transportation needs - non-medical: Not on file  Occupational History  . Not on file  Tobacco Use  . Smoking status: Never Smoker  . Smokeless  tobacco: Never Used  Substance and Sexual Activity  . Alcohol use: No  . Drug use: No  . Sexual activity: Not on file  Other Topics Concern  . Not on file  Social History Narrative  . Not on file    No Known Allergies  Current Outpatient Medications  Medication Sig Dispense Refill  . amLODipine (NORVASC) 10 MG tablet Take 1 tablet (10 mg total) by mouth daily. 90 tablet 3  . carvedilol (COREG) 25 MG tablet Take 1 tablet (25 mg total) 2 (two) times daily by mouth. 60 tablet 3  . ferrous sulfate 325 (65 FE) MG tablet Take 1 tablet (325 mg total) by mouth 2 (two) times daily with a meal. 60 tablet 0  . hydrALAZINE (APRESOLINE) 100 MG tablet Take 1 tablet (100 mg total) by mouth every 8 (eight) hours. 270 tablet 1  . isosorbide mononitrate (IMDUR) 30 MG 24 hr tablet Take 1 tablet (30 mg total) by mouth daily. 90 tablet 3  . vitamin C (ASCORBIC ACID) 250 MG tablet Take 1 tablet (250 mg total) by mouth 2 (two) times daily. 60 tablet 0   No current facility-administered medications for this visit.     REVIEW OF SYSTEMS:  [X]  denotes positive finding, [ ]  denotes negative finding Cardiac  Comments:  Chest pain or chest pressure:    Shortness of breath upon exertion:    Short of breath when lying flat:    Irregular heart  rhythm:        Vascular    Pain in calf, thigh, or hip brought on by ambulation:    Pain in feet at night that wakes you up from your sleep:     Blood clot in your veins:    Leg swelling:         Pulmonary    Oxygen at home:    Productive cough:     Wheezing:         Neurologic    Sudden weakness in arms or legs:     Sudden numbness in arms or legs:     Sudden onset of difficulty speaking or slurred speech:    Temporary loss of vision in one eye:     Problems with dizziness:         Gastrointestinal    Blood in stool:     Vomited blood:         Genitourinary    Burning when urinating:     Blood in urine:        Psychiatric    Major depression:          Hematologic    Bleeding problems:    Problems with blood clotting too easily:        Skin    Rashes or ulcers:        Constitutional    Fever or chills:     PHYSICAL EXAM:   Vitals:   01/02/17 1213 01/02/17 1216  BP: (!) 185/90 (!) 180/89  Pulse: 90   Resp: 16   Temp: (!) 97.3 F (36.3 C)   TempSrc: Oral   SpO2: 100%   Weight: 167 lb 9.6 oz (76 kg)   Height: 5\' 5"  (1.651 m)     GENERAL: The patient is a well-nourished female, in no acute distress. The vital signs are documented above. CARDIAC: There is a regular rate and rhythm.  VASCULAR: I do not detect carotid bruits. She has palpable brachial and radial pulses bilaterally. She has no significant lower extremity swelling. PULMONARY: There is good air exchange bilaterally without wheezing or rales. ABDOMEN: Soft and non-tender with normal pitched bowel sounds.  MUSCULOSKELETAL: There are no major deformities or cyanosis. NEUROLOGIC: No focal weakness or paresthesias are detected. SKIN: There are no ulcers or rashes noted. PSYCHIATRIC: The patient has a normal affect.  DATA:    UPPER EXTREMITY ARTERIAL DUPLEX: I have independently interpreted her upper extremity arterial duplex scan.  On the right side there is a triphasic radial and ulnar waveform.  The brachial artery is 0.53 cm in diameter.  On the left side there is a triphasic radial and ulnar waveform.  The brachial artery measures 0.66 cm in diameter.  UPPER EXTREMITY VEIN MAP: I have independently interpreted her upper extremity vein map.  On the right side of the forearm cephalic vein looks marginal in size, the upper arm cephalic vein is reasonable.  The basilic vein looks reasonable in size but empties into the brachial system early.  On the left side the forearm cephalic vein looks marginal in size.  The upper arm cephalic vein likewise looks marginal in size.  Basilic vein looks reasonable although it empties into the brachial system early.  LABS:  Labs done on 11/21/2016 show a GFR of 8.  Renal ultrasound in September showed increased cortical echogenicity bilaterally consistent with medical renal disease.  MEDICAL ISSUES:   STAGE V CHRONIC KIDNEY DISEASE: This patient appears to be reasonable candidate for  a fistula in the left arm.  I think most likely she could have a left brachiocephalic fistula.  If this were not possible she could potentially have a basilic vein transposition.  If neither were possible she could have an AV graft. I have explained the indications for placement of an AV fistula or AV graft. I've explained that if at all possible we will place an AV fistula.  I have reviewed the risks of placement of an AV fistula including but not limited to: failure of the fistula to mature, need for subsequent interventions, and thrombosis. In addition I have reviewed the potential complications of placement of an AV graft. These risks include, but are not limited to, graft thrombosis, graft infection, wound healing problems, bleeding, arm swelling, and steal syndrome. All the patient's questions were answered and they are agreeable to proceed with surgery.  She does not want to schedule this today as she has to check her calendar for other appointments.  She will call to schedule her surgery in the near future.  Deitra Mayo [facility] [phone]

## 2017-01-21 ENCOUNTER — Other Ambulatory Visit: Payer: Self-pay | Admitting: Family

## 2017-01-21 DIAGNOSIS — D472 Monoclonal gammopathy: Secondary | ICD-10-CM

## 2017-01-22 ENCOUNTER — Telehealth: Payer: Self-pay | Admitting: *Deleted

## 2017-01-22 ENCOUNTER — Ambulatory Visit (HOSPITAL_BASED_OUTPATIENT_CLINIC_OR_DEPARTMENT_OTHER): Payer: 59 | Admitting: Family

## 2017-01-22 ENCOUNTER — Other Ambulatory Visit (HOSPITAL_BASED_OUTPATIENT_CLINIC_OR_DEPARTMENT_OTHER): Payer: 59

## 2017-01-22 ENCOUNTER — Other Ambulatory Visit: Payer: Self-pay

## 2017-01-22 VITALS — BP 179/80 | HR 70 | Temp 98.0°F | Resp 20 | Wt 169.5 lb

## 2017-01-22 DIAGNOSIS — N185 Chronic kidney disease, stage 5: Secondary | ICD-10-CM

## 2017-01-22 DIAGNOSIS — E119 Type 2 diabetes mellitus without complications: Secondary | ICD-10-CM | POA: Diagnosis not present

## 2017-01-22 DIAGNOSIS — D472 Monoclonal gammopathy: Secondary | ICD-10-CM | POA: Diagnosis not present

## 2017-01-22 DIAGNOSIS — I10 Essential (primary) hypertension: Secondary | ICD-10-CM

## 2017-01-22 LAB — CBC WITH DIFFERENTIAL (CANCER CENTER ONLY)
BASO#: 0 10*3/uL (ref 0.0–0.2)
BASO%: 0.6 % (ref 0.0–2.0)
EOS ABS: 0.2 10*3/uL (ref 0.0–0.5)
EOS%: 2.4 % (ref 0.0–7.0)
HEMATOCRIT: 31.2 % — AB (ref 34.8–46.6)
HEMOGLOBIN: 10.3 g/dL — AB (ref 11.6–15.9)
LYMPH#: 1.6 10*3/uL (ref 0.9–3.3)
LYMPH%: 25.8 % (ref 14.0–48.0)
MCH: 23.4 pg — AB (ref 26.0–34.0)
MCHC: 33 g/dL (ref 32.0–36.0)
MCV: 71 fL — ABNORMAL LOW (ref 81–101)
MONO#: 0.4 10*3/uL (ref 0.1–0.9)
MONO%: 7 % (ref 0.0–13.0)
NEUT%: 64.2 % (ref 39.6–80.0)
NEUTROS ABS: 4.1 10*3/uL (ref 1.5–6.5)
Platelets: 195 10*3/uL (ref 145–400)
RBC: 4.4 10*6/uL (ref 3.70–5.32)
RDW: 16.1 % — ABNORMAL HIGH (ref 11.1–15.7)
WBC: 6.3 10*3/uL (ref 3.9–10.0)

## 2017-01-22 LAB — CHCC SATELLITE - SMEAR

## 2017-01-22 LAB — CMP (CANCER CENTER ONLY)
ALBUMIN: 3.9 g/dL (ref 3.3–5.5)
ALT(SGPT): 17 U/L (ref 10–47)
AST: 19 U/L (ref 11–38)
Alkaline Phosphatase: 98 U/L — ABNORMAL HIGH (ref 26–84)
BILIRUBIN TOTAL: 0.6 mg/dL (ref 0.20–1.60)
BUN, Bld: 56 mg/dL — ABNORMAL HIGH (ref 7–22)
CALCIUM: 9.7 mg/dL (ref 8.0–10.3)
CO2: 22 meq/L (ref 18–33)
Chloride: 109 mEq/L — ABNORMAL HIGH (ref 98–108)
Creat: 6.7 mg/dl (ref 0.6–1.2)
Glucose, Bld: 110 mg/dL (ref 73–118)
Potassium: 4 mEq/L (ref 3.3–4.7)
Sodium: 145 mEq/L (ref 128–145)
TOTAL PROTEIN: 8.1 g/dL (ref 6.4–8.1)

## 2017-01-22 LAB — LACTATE DEHYDROGENASE: LDH: 177 U/L (ref 125–245)

## 2017-01-22 NOTE — Telephone Encounter (Signed)
Critical Value Creatinine 6.7 Laverna Peace NP notified. No orders at this time

## 2017-01-22 NOTE — Progress Notes (Signed)
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 M-spike of 10.7% was noted. She has occasional fatigue at times and likes ice.  She has stage V kidney failure and is waiting to schedule placement of AV graft for dialysis.  She states that she is still making urine many times a day.  She has a long history of uncontrolled diabetes and hypertension.  No history of thrombus, stroke of MI.  She is taking an oral iron supplement daily. She is not currently on folic acid.  Her cycle is irregular and she states that it can be heavy at times. She feels that she may be starting to go through the change.  She has 2 children and no history of MC.  She states that her mother and maternal aunt both have the sickle cell trait but that she does not as far as she knows.  No fever, chills, n/v, cough, rash, dizziness, SOB, chest pain, palpitations, abdominal pain or changes in bowel or bladder habits.  She has plantar warts on her hands. She states that her dermatologist will remove them as needed.  No swelling, tenderness, numbness or tingling in her extremities at this time.  She has a good appetite and is staying well hydrated. Her weight is stable.  She weighs herself daily and states that she will have bloating and puffiness in her feet and ankles with her cycle. She states that she will gain up to 3 lbs during this time. Once her cycle ends this goes back down.  She does not smoke or drink alcoholic beverages.  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   ROS: All other 10 point review of systems is negative.   PAST MEDICAL HISTORY:   Past Medical History:  Diagnosis Date  . AKI (acute kidney injury) ([place])  10/27/2016  . Cardiomyopathy ([place]) 11/06/2016  . CHF (congestive heart failure) ([place]) 10/27/2016  . Diabetes mellitus without complication ([place])   . Elevated troponin 10/27/2016  . Hypertension   . Hypertensive emergency 10/27/2016  . Microcytic hypochromic anemia 10/27/2016  . Noncompliance with medication regimen   . Stenosis of ureteropelvic junction (UPJ)   . Systolic and diastolic CHF, acute ([place])   . Type 2 diabetes mellitus with renal manifestations ([place]) 10/27/2016    ALLERGIES: No Known Allergies    MEDICATIONS:  Current Outpatient Medications on File Prior to Visit  Medication Sig Dispense Refill  . amLODipine (NORVASC) 10 MG tablet Take 1 tablet (10 mg total) by mouth daily. 90 tablet 3  . carvedilol (COREG) 25 MG tablet Take 1 tablet (25 mg total) 2 (two) times daily by mouth. 60 tablet 3  . ferrous sulfate 325 (65 FE) MG tablet Take 1 tablet (325 mg total) by mouth 2 (two) times daily with a meal. 60 tablet 0  . hydrALAZINE (APRESOLINE) 100 MG tablet Take 1 tablet (100 mg total) by mouth every 8 (eight) hours. 270 tablet 1  . isosorbide mononitrate (IMDUR) 30 MG 24 hr tablet Take 1 tablet (30 mg total) by mouth daily. 90 tablet 3  . vitamin C (ASCORBIC ACID) 250 MG tablet Take 1 tablet (250 mg total) by mouth 2 (two)  times daily. 60 tablet 0   No current facility-administered medications on file prior to visit.      PAST SURGICAL HISTORY Past Surgical History:  Procedure Laterality Date  . CHOLECYSTECTOMY    . OVARIAN CYST SURGERY      FAMILY HISTORY: Family History  Problem Relation Age of Onset  . Congestive Heart Failure Father     SOCIAL HISTORY:  reports that  has never smoked. she has never used smokeless tobacco. She reports that she does not drink alcohol or use drugs.  PERFORMANCE STATUS: The patient's performance status is 1 - Symptomatic but completely ambulatory  PHYSICAL EXAM: Most Recent Vital Signs: There were no vitals taken for this visit. There  were no vitals taken for this visit.  General Appearance:    Alert, cooperative, no distress, appears stated age  Head:    Normocephalic, without obvious abnormality, atraumatic  Eyes:    PERRL, conjunctiva/corneas clear, EOM's intact, fundi    benign, both eyes        Throat:   Lips, mucosa, and tongue normal; teeth and gums normal  Neck:   Supple, symmetrical, trachea midline, no adenopathy;    thyroid:  no enlargement/tenderness/nodules; no carotid   bruit or JVD  Back:     Symmetric, no curvature, ROM normal, no CVA tenderness  Lungs:     Clear to auscultation bilaterally, respirations unlabored  Chest Wall:    No tenderness or deformity   Heart:    Regular rate and rhythm, S1 and S2 normal, no murmur, rub   or gallop     Abdomen:     Soft, non-tender, bowel sounds active all four quadrants,    no masses, no organomegaly        Extremities:   Extremities normal, atraumatic, no cyanosis or edema  Pulses:   2+ and symmetric all extremities  Skin:   Skin color, texture, turgor normal, no rashes or lesions  Lymph nodes:   Cervical, supraclavicular, and axillary nodes normal  Neurologic:   CNII-XII intact, normal strength, sensation and reflexes    throughout    LABORATORY DATA:  Results for orders placed or performed in visit on 01/22/17 (from the past 48 hour(s))  CBC w/Diff     Status: Abnormal   Collection Time: 01/22/17  8:38 AM  Result Value Ref Range   WBC 6.3 3.9 - 10.0 10e3/uL   RBC 4.40 3.70 - 5.32 10e6/uL   HGB 10.3 (L) 11.6 - 15.9 g/dL   HCT 31.2 (L) 34.8 - 46.6 %   MCV 71 (L) 81 - 101 fL   MCH 23.4 (L) 26.0 - 34.0 pg   MCHC 33.0 32.0 - 36.0 g/dL   RDW 16.1 (H) 11.1 - 15.7 %   Platelets 195 145 - 400 10e3/uL   NEUT# 4.1 1.5 - 6.5 10e3/uL   LYMPH# 1.6 0.9 - 3.3 10e3/uL   MONO# 0.4 0.1 - 0.9 10e3/uL   Eosinophils Absolute 0.2 0.0 - 0.5 10e3/uL   BASO# 0.0 0.0 - 0.2 10e3/uL   NEUT% 64.2 39.6 - 80.0 %   LYMPH% 25.8 14.0 - 48.0 %   MONO% 7.0 0.0 - 13.0 %    EOS% 2.4 0.0 - 7.0 %   BASO% 0.6 0.0 - 2.0 %      RADIOGRAPHY: No results found.     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 We have repeat her protein studies and weill  see what her M-spike today is. She has had no issue with infections.  She has occasional fatigue but otherwise has no complaints at this time.  She has uncontrolled HTN and DM that have lead to end stage renal disease. She will be scheduling AV graft placement for dialysis soon.  We will go ahead and plan to see her back again in 6 months for follow-up and lab.   All questions were answered and she is in agreement with the plan. She will contact our office with any questions or concerns. We can certainly see her much sooner if necessary.  She was discussed with and also seen by Dr. Marin Olp and he is in agreement with the aforementioned.   [facility]     Addendum: I saw and examined the patient with Tymel Conely.  I agree with the above assessment.  I does have a hard time believing that there is any hematologic issue with Ms. Finfrock.  If there is an MGUS, it is inconsequential.  I cannot imagine that an MGUS would be a sign of a plasma cell disorder that would be causing any type of renal issue.  She has long-standing diabetes and uncontrolled hypertension.  We spent about 40 minutes with her.  We will see what her SPEP is.  We answered her questions.  We reassured her that we did not think that there was any hematologic malignancy, i.e. myeloma, that she had to worry about.  I would like to follow her up.  I would like to get her back in about 6 months.  We will see how her SPEP looks at that time.  Lattie Haw, MD

## 2017-01-23 LAB — IGG, IGA, IGM
IGM (IMMUNOGLOBIN M), SRM: 48 mg/dL (ref 26–217)
IgA, Qn, Serum: 258 mg/dL (ref 87–352)
IgG, Qn, Serum: 1600 mg/dL (ref 700–1600)

## 2017-01-23 LAB — KAPPA/LAMBDA LIGHT CHAINS
IG LAMBDA FREE LIGHT CHAIN: 47 mg/L — AB (ref 5.7–26.3)
Ig Kappa Free Light Chain: 656.2 mg/L — ABNORMAL HIGH (ref 3.3–19.4)
KAPPA/LAMBDA FLC RATIO: 13.96 — AB (ref 0.26–1.65)

## 2017-01-24 LAB — PROTEIN ELECTROPHORESIS, SERUM, WITH REFLEX
A/G Ratio: 1.1 (ref 0.7–1.7)
ALBUMIN: 3.9 g/dL (ref 2.9–4.4)
ALPHA 1: 0.2 g/dL (ref 0.0–0.4)
ALPHA 2: 0.6 g/dL (ref 0.4–1.0)
Beta: 1 g/dL (ref 0.7–1.3)
Gamma Globulin: 1.5 g/dL (ref 0.4–1.8)
Globulin, Total: 3.4 g/dL (ref 2.2–3.9)
TOTAL PROTEIN: 7.3 g/dL (ref 6.0–8.5)

## 2017-01-29 ENCOUNTER — Ambulatory Visit: Payer: 59

## 2017-01-29 ENCOUNTER — Other Ambulatory Visit: Payer: Self-pay | Admitting: Family

## 2017-01-29 DIAGNOSIS — R768 Other specified abnormal immunological findings in serum: Secondary | ICD-10-CM

## 2017-01-29 DIAGNOSIS — D472 Monoclonal gammopathy: Secondary | ICD-10-CM

## 2017-01-30 ENCOUNTER — Telehealth: Payer: Self-pay | Admitting: *Deleted

## 2017-01-30 NOTE — Telephone Encounter (Addendum)
Message left on personal voice mail  Attempted to call patient twice, and she called the office back once without connecting. Message left for her to come in and get supplies.   ----- Message from Eliezer Bottom, NP sent at 01/29/2017 10:42 AM EST ----- We need a 24 hour urine on her please. I will put in the order. She will need to come pick up the supplies. Thank you!  Sarah  ----- Message ----- From: Interface, Lab In Three Zero One Sent: 01/22/2017   8:53 AM To: Eliezer Bottom, NP

## 2017-02-25 ENCOUNTER — Encounter: Payer: Self-pay | Admitting: Cardiology

## 2017-02-25 ENCOUNTER — Ambulatory Visit (INDEPENDENT_AMBULATORY_CARE_PROVIDER_SITE_OTHER): Payer: 59 | Admitting: Cardiology

## 2017-02-25 VITALS — BP 192/90 | HR 80 | Ht 65.0 in | Wt 171.0 lb

## 2017-02-25 DIAGNOSIS — I509 Heart failure, unspecified: Secondary | ICD-10-CM

## 2017-02-25 DIAGNOSIS — I130 Hypertensive heart and chronic kidney disease with heart failure and stage 1 through stage 4 chronic kidney disease, or unspecified chronic kidney disease: Secondary | ICD-10-CM | POA: Diagnosis not present

## 2017-02-25 DIAGNOSIS — I10 Essential (primary) hypertension: Secondary | ICD-10-CM

## 2017-02-25 DIAGNOSIS — N185 Chronic kidney disease, stage 5: Secondary | ICD-10-CM

## 2017-02-25 MED ORDER — ISOSORBIDE MONONITRATE ER 60 MG PO TB24: 60.0000 mg | ORAL_TABLET | Freq: Every day | ORAL | 3 refills | Status: DC

## 2017-02-25 NOTE — Progress Notes (Signed)
Cardiology Office Note    Date:  02/25/2017   ID:  Anandi Abramo, DOB 12/22/1970, MRN [id number]  PCP:  Patient, No Pcp Per  Cardiologist:  Dr. Meda Coffee  Chief Complaint: Hospital follow up for HTN and CHF  History of Present Illness:   Mallory Collier is a 47 y.o. female with a h/o HTN, DM, CKD stage 5 and medical nonadherence who is recently admitted with hypertensive emergency with renal failure and CHF presents for follow up. Echo showed LVEF of 20-25% and RV failure with grade 2 DD. Likely due to hypertensive disease due to medication non compliance.Not currently a candidate for cath/ invasive procedures. Renal ultrasound reveals cortical renal disease. Plan to follow up with Dr. Justin Mend as outpatient. Elevated troponin  felt demand. Also treated with abx for UTI. Referred to urology for ureteropelvic junction stenosis as outpatient.   Here today for follow up.  Patient was seen by Dr. Justin Mend Discontinued metoprolol and potassium , started on labetalol 100 mg 2 times a day. Her blood pressure runs in 150 over 80s at home. She denies any orthopnea, PND, syncope, melena,  Chest pain, shortness of breath.  Follows low sodium diet intermittently and associated LE edema.   02/25/17 - 4 months follow up, the patient staes that she has been compliant with her meds, denies chest pain, SOB, palpitations, LE edema. Non- compliant with low sodium diet.  Past Medical History:  Diagnosis Date  . AKI (acute kidney injury) ([place]) 10/27/2016  . Cardiomyopathy ([place]) 11/06/2016  . CHF (congestive heart failure) ([place]) 10/27/2016  . Diabetes mellitus without complication ([place])   . Elevated troponin 10/27/2016  . Hypertension   . Hypertensive emergency 10/27/2016  . Microcytic hypochromic anemia 10/27/2016  . Noncompliance with medication regimen   . Stenosis of ureteropelvic junction (UPJ)   . Systolic and diastolic CHF, acute ([place])   . Type 2 diabetes mellitus with renal manifestations ([place]) 10/27/2016     Past Surgical History:  Procedure Laterality Date  . CHOLECYSTECTOMY    . OVARIAN CYST SURGERY      Current Medications: Prior to Admission medications   Medication Sig Start Date End Date Taking? Authorizing Provider  amLODipine (NORVASC) 10 MG tablet Take 1 tablet (10 mg total) by mouth daily. 11/28/16   Leanor Kail, PA  ferrous sulfate 325 (65 FE) MG tablet Take 1 tablet (325 mg total) by mouth 2 (two) times daily with a meal. 10/30/16   Allie Bossier, MD  hydrALAZINE (APRESOLINE) 50 MG tablet Take 1 tablet (50 mg total) by mouth every 8 (eight) hours. 11/28/16   Bhagat, Crista Luria, PA  isosorbide mononitrate (IMDUR) 30 MG 24 hr tablet Take 1 tablet (30 mg total) by mouth daily. 11/28/16   Bhagat, Crista Luria, PA  metoprolol tartrate (LOPRESSOR) 100 MG tablet Take 1 tablet (100 mg total) by mouth 2 (two) times daily. 10/30/16   Allie Bossier, MD  potassium chloride 20 MEQ TBCR Take 20 mEq by mouth daily. 10/31/16   Allie Bossier, MD  vitamin C (ASCORBIC ACID) 250 MG tablet Take 1 tablet (250 mg total) by mouth 2 (two) times daily. 10/30/16   Allie Bossier, MD   Allergies:   Patient has no known allergies.   Social History   Socioeconomic History  . Marital status: Married    Spouse name: None  . Number of children: None  . Years of education: None  . Highest education level: None  Social Needs  . Financial resource strain:  None  . Food insecurity - worry: None  . Food insecurity - inability: None  . Transportation needs - medical: None  . Transportation needs - non-medical: None  Occupational History  . None  Tobacco Use  . Smoking status: Never Smoker  . Smokeless tobacco: Never Used  Substance and Sexual Activity  . Alcohol use: No  . Drug use: No  . Sexual activity: None  Other Topics Concern  . None  Social History Narrative  . None     Family History:  The patient's family history includes Congestive Heart Failure in her father.   ROS:    Please see the history of present illness.    ROS All other systems reviewed and are negative.   PHYSICAL EXAM:   VS:  BP (!) 192/90   Pulse 80   Ht 5\' 5"  (1.651 m)   Wt 171 lb (77.6 kg)   SpO2 99%   BMI 28.46 kg/m    GEN: Well nourished, well developed, in no acute distress  HEENT: normal  Neck: no JVD, carotid bruits, or masses Cardiac: RRR; no murmurs, rubs, or gallops,no edema  Respiratory:  clear to auscultation bilaterally, normal work of breathing GI: soft, nontender, nondistended, + BS MS: no deformity or atrophy  Skin: warm and dry, no rash Neuro:  Alert and Oriented x 3, Strength and sensation are intact Psych: euthymic mood, full affect  Wt Readings from Last 3 Encounters:  02/25/17 171 lb (77.6 kg)  01/22/17 169 lb 8 oz (76.9 kg)  01/02/17 167 lb 9.6 oz (76 kg)    Studies/Labs Reviewed:   EKG:  EKG is ordered today.  The ekg ordered today demonstrates  Sinus rhythm at rate of 81 bpm, PVC, LVH  Recent Labs: 10/26/2016: B Natriuretic Peptide >4,500.0; TSH 1.901 10/28/2016: Magnesium 2.6 01/22/2017: ALT(SGPT) 17; BUN, Bld 56; Creat 6.7; HGB 10.3; Platelets 195; Potassium 4.0; Sodium 145   Lipid Panel No results found for: CHOL, TRIG, HDL, CHOLHDL, VLDL, LDLCALC, LDLDIRECT  Additional studies/ records that were reviewed today include:   TTE: 10/27/16 - Left ventricle: The cavity size was normal. Wall thickness was increased in a pattern of moderate LVH. Systolic function was severely reduced. The estimated ejection fraction was in the range of 20% to 25%. Diffuse hypokinesis. Features are consistent with a pseudonormal left ventricular filling pattern, with concomitant abnormal relaxation and increased filling pressure (grade 2 diastolic dysfunction). Doppler parameters are consistent with high ventricular filling pressure. - Regional wall motion abnormality: Akinesis of the mid anteroseptal and mid inferoseptal myocardium; severe  hypokinesis of the mid anterior and mid inferior myocardium. - Mitral valve: There was mild regurgitation. - Left atrium: The atrium was severely dilated. - Right ventricle: Systolic function was moderately to severely reduced. - Systemic veins: Dilated IVC with normal respiratory variation. Estimated CVP 8 mmHg.   ASSESSMENT & PLAN:   1. Hypertensive heart disease with CHF and CKD -  Blood pressure is elevated again today. Will increase Imdur to 60 mg po daily, continue all the other BP meds.  2. Chronic combined CHF - Echo with biventricular failure. Not a candidate for invasive procedure.  Euvolemic. Advice to compliance with low-sodium diet Will review with Dr. Meda Coffee if requires stress test or not. No chest pain or dyspnea.  - Continue BB, Hydralazine and Imdur. Not on ACE/ARB due to CKD.   3. CKD stage IV - Followed by Dr. Justin Mend.  No current plan for dialysis. May place  Excess in near  future.   4. Anemia - Per PCP  5. DM - A1c 6.3. Controlled. Per PCP  Medication Adjustments/Labs and Tests Ordered: Current medicines are reviewed at length with the patient today.  Concerns regarding medicines are outlined above.  Medication changes, Labs and Tests ordered today are listed in the Patient Instructions below. There are no Patient Instructions on file for this visit.   Signed, Ena Dawley, MD  02/25/2017 11:35 AM    [address], [place], [place]  [id number] Phone: [phone]; Fax: [phone]

## 2017-02-25 NOTE — Patient Instructions (Signed)
Medication Instructions:  Your physician has recommended you make the following change in your medication:   INCREASE: Imdur to 60 mg once a day  Labwork: None ordered   Testing/Procedures: Your physician has requested that you have an echocardiogram. Echocardiography is a painless test that uses sound waves to create images of your heart. It provides your doctor with information about the size and shape of your heart and how well your heart's chambers and valves are working. This procedure takes approximately one hour. There are no restrictions for this procedure.   Follow-Up: Your physician recommends that you schedule a follow-up appointment in: 3 months with Dr. Meda Coffee  Any Other Special Instructions Will Be Listed Below (If Applicable).     If you need a refill on your cardiac medications before your next appointment, please call your pharmacy.

## 2017-03-06 ENCOUNTER — Ambulatory Visit (HOSPITAL_COMMUNITY): Payer: 59 | Attending: Internal Medicine

## 2017-03-06 ENCOUNTER — Other Ambulatory Visit: Payer: Self-pay

## 2017-03-06 DIAGNOSIS — E1122 Type 2 diabetes mellitus with diabetic chronic kidney disease: Secondary | ICD-10-CM | POA: Insufficient documentation

## 2017-03-06 DIAGNOSIS — I071 Rheumatic tricuspid insufficiency: Secondary | ICD-10-CM | POA: Insufficient documentation

## 2017-03-06 DIAGNOSIS — I509 Heart failure, unspecified: Secondary | ICD-10-CM | POA: Diagnosis present

## 2017-03-06 DIAGNOSIS — I130 Hypertensive heart and chronic kidney disease with heart failure and stage 1 through stage 4 chronic kidney disease, or unspecified chronic kidney disease: Secondary | ICD-10-CM | POA: Diagnosis not present

## 2017-03-06 DIAGNOSIS — N189 Chronic kidney disease, unspecified: Secondary | ICD-10-CM | POA: Insufficient documentation

## 2017-05-28 ENCOUNTER — Ambulatory Visit: Payer: 59 | Admitting: Cardiology

## 2017-06-04 ENCOUNTER — Other Ambulatory Visit: Payer: Self-pay | Admitting: Cardiology

## 2017-06-24 ENCOUNTER — Encounter

## 2017-06-24 ENCOUNTER — Ambulatory Visit: Payer: 59 | Admitting: Cardiology

## 2017-07-23 ENCOUNTER — Inpatient Hospital Stay: Payer: 59

## 2017-07-23 ENCOUNTER — Ambulatory Visit: Payer: 59 | Admitting: Family

## 2017-07-23 NOTE — Progress Notes (Signed)
Cardiology Office Note    Date:  07/24/2017   ID:  Mallory Collier, DOB 1970-06-29, MRN [id number]  PCP:  Patient, No Pcp Per  Cardiologist: Ena Dawley, MD  No chief complaint on file.   History of Present Illness:  Mallory Collier is a 47 y.o. female with history of hypertension, DM, CKD stage V, medical noncompliance.  He had an admission 10/2016 with hypertensive emergency with renal failure and CHF.  2D echo LVEF 20 to 25% with RV failure and grade 2 DD.  Was not felt to be a candidate for catheter invasive procedure.  Renal ultrasound showed cortical renal disease and she is followed by Dr. Justin Mend. Last saw Dr. Meda Coffee 02/25/2017 and Imdur increased to 60 mg daily for better blood pressure control.  2D echo in January LVEF improved to 45 to 50%.  Patient comes in today for follow-up.  She took her medicines 10 minutes before she came in and her blood pressure is slightly elevated.  She says she can feel her blood pressure up if she forgets to take her medications but overall she thinks it has been okay.  She does not have a cuff to check.  Missed an appointment with renal yesterday.  Last creatinine 6.7 in December.   Past Medical History:  Diagnosis Date  . AKI (acute kidney injury) ([place]) 10/27/2016  . Cardiomyopathy ([place]) 11/06/2016  . CHF (congestive heart failure) ([place]) 10/27/2016  . Diabetes mellitus without complication ([place])   . Elevated troponin 10/27/2016  . Hypertension   . Hypertensive emergency 10/27/2016  . Microcytic hypochromic anemia 10/27/2016  . Noncompliance with medication regimen   . Stenosis of ureteropelvic junction (UPJ)   . Systolic and diastolic CHF, acute ([place])   . Type 2 diabetes mellitus with renal manifestations ([place]) 10/27/2016    Past Surgical History:  Procedure Laterality Date  . CHOLECYSTECTOMY    . OVARIAN CYST SURGERY      Current Medications: Current Meds  Medication Sig  . amLODipine (NORVASC) 10 MG tablet Take 1 tablet (10 mg total) by  mouth daily.  . carvedilol (COREG) 25 MG tablet TAKE 1 TABLET BY MOUTH TWICE DAILY  . ferrous sulfate 325 (65 FE) MG tablet Take 1 tablet (325 mg total) by mouth 2 (two) times daily with a meal.  . hydrALAZINE (APRESOLINE) 100 MG tablet Take 1 tablet (100 mg total) by mouth every 8 (eight) hours.  . isosorbide mononitrate (IMDUR) 60 MG 24 hr tablet Take 1 tablet (60 mg total) by mouth daily.  . sodium bicarbonate 650 MG tablet Take 650 mg by mouth 4 (four) times daily.  . vitamin C (ASCORBIC ACID) 250 MG tablet Take 1 tablet (250 mg total) by mouth 2 (two) times daily.     Allergies:   Patient has no known allergies.   Social History   Socioeconomic History  . Marital status: Married    Spouse name: Not on file  . Number of children: Not on file  . Years of education: Not on file  . Highest education level: Not on file  Occupational History  . Not on file  Social Needs  . Financial resource strain: Not on file  . Food insecurity:    Worry: Not on file    Inability: Not on file  . Transportation needs:    Medical: Not on file    Non-medical: Not on file  Tobacco Use  . Smoking status: Never Smoker  . Smokeless tobacco: Never Used  Substance and  Sexual Activity  . Alcohol use: No  . Drug use: No  . Sexual activity: Not on file  Lifestyle  . Physical activity:    Days per week: Not on file    Minutes per session: Not on file  . Stress: Not on file  Relationships  . Social connections:    Talks on phone: Not on file    Gets together: Not on file    Attends religious service: Not on file    Active member of club or organization: Not on file    Attends meetings of clubs or organizations: Not on file    Relationship status: Not on file  Other Topics Concern  . Not on file  Social History Narrative  . Not on file     Family History:  The patient's family history includes Congestive Heart Failure in her father.   ROS:   Please see the history of present illness.      Review of Systems  Constitution: Negative.  HENT: Negative.   Eyes: Negative.   Cardiovascular: Negative.   Respiratory: Negative.   Hematologic/Lymphatic: Negative.   Musculoskeletal: Negative.  Negative for joint pain.  Gastrointestinal: Positive for constipation.  Genitourinary: Negative.   Neurological: Negative.    All other systems reviewed and are negative.   PHYSICAL EXAM:   VS:  BP (!) 162/88   Pulse 68   Ht 5' 5"  (1.651 m)   Wt 179 lb (81.2 kg)   SpO2 99%   BMI 29.79 kg/m  Recheck blood pressure 158/88 Physical Exam  GEN: Well nourished, well developed, in no acute distress  Neck: no JVD, carotid bruits, or masses Cardiac:RRR; positive S4 Respiratory:  clear to auscultation bilaterally, normal work of breathing GI: soft, nontender, nondistended, + BS Ext: without cyanosis, clubbing, or edema, Good distal pulses bilaterally Neuro:  Alert and Oriented x 3 Psych: euthymic mood, full affect  Wt Readings from Last 3 Encounters:  07/24/17 179 lb (81.2 kg)  02/25/17 171 lb (77.6 kg)  01/22/17 169 lb 8 oz (76.9 kg)      Studies/Labs Reviewed:   EKG:  EKG is  ordered today.  The ekg ordered today demonstrates normal sinus rhythm with LVH and ST-T wave changes inferior lateral  Recent Labs: 10/26/2016: B Natriuretic Peptide >4,500.0; TSH 1.901 10/28/2016: Magnesium 2.6 01/22/2017: ALT(SGPT) 17; BUN, Bld 56; Creat 6.7; HGB 10.3; Platelets 195; Potassium 4.0; Sodium 145   Lipid Panel No results found for: CHOL, TRIG, HDL, CHOLHDL, VLDL, LDLCALC, LDLDIRECT  Additional studies/ records that were reviewed today include:  2D echo 02/2017 Study Conclusions   - Left ventricle: The cavity size was normal. There was severe   concentric hypertrophy. Systolic function was mildly reduced. The   estimated ejection fraction was in the range of 45% to 50%.   Diffuse hypokinesis. Doppler parameters are consistent with   abnormal left ventricular relaxation (grade 1 diastolic    dysfunction). The E/e&' ratio is between 8-15, suggesting   indeterminate LV filling pressure. Ejection fraction (MOD,   2-plane): 48%. - Mitral valve: Mildly thickened leaflets . There was trivial   regurgitation. - Left atrium: Moderately dilated. - Tricuspid valve: There was mild regurgitation. - Pulmonary arteries: PA peak pressure: 27 mm Hg (S). - Inferior vena cava: The vessel was normal in size. The   respirophasic diameter changes were in the normal range (= 50%),   consistent with normal central venous pressure.   Impressions:   - Compared to a prior study  in 10/2016, the LVEF has improved from   20-25% up to 45-50%.    TTE: 10/27/16 - Left ventricle: The cavity size was normal. Wall thickness was   increased in a pattern of moderate LVH. Systolic function was   severely reduced. The estimated ejection fraction was in the   range of 20% to 25%. Diffuse hypokinesis. Features are consistent   with a pseudonormal left ventricular filling pattern, with   concomitant abnormal relaxation and increased filling pressure   (grade 2 diastolic dysfunction). Doppler parameters are   consistent with high ventricular filling pressure. - Regional wall motion abnormality: Akinesis of the mid   anteroseptal and mid inferoseptal myocardium; severe hypokinesis   of the mid anterior and mid inferior myocardium. - Mitral valve: There was mild regurgitation. - Left atrium: The atrium was severely dilated. - Right ventricle: Systolic function was moderately to severely   reduced. - Systemic veins: Dilated IVC with normal respiratory variation.   Estimated CVP 8 mmHg.         ASSESSMENT:    1. Chronic combined systolic and diastolic congestive heart failure (HCC)   2. Cardiomyopathy, unspecified type ([place])   3. 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  Avoids salt.  Cardiomyopathy felt to be hypertensive LVEF 20 to 25% with diffuse hypokinesis and grade 2 DD on echo 10/2016, improved to 40 to 45% on echo 02/2017.  Hypertensive heart disease with chronic CKD stage V-last creatinine 6.7 in December 2018.  Missed appointment with renal yesterday.  We will check be met today.  Blood pressure slightly elevated today but she just took her medications.  Recommend she follow-up in our hypertension clinic in 2 to 3 weeks to ensure her blood pressure stable.  Asked her to reschedule her appointment with renal as soon as possible.    Medication Adjustments/Labs and Tests Ordered: Current medicines are reviewed at length with the patient today.  Concerns regarding medicines are outlined above.  Medication changes, Labs and Tests ordered today are listed in the Patient Instructions below. There are no Patient Instructions on file for this visit.   Sumner Boast, PA-C  07/24/2017 9:31 AM    [facility] [place], [place], [place]  [address] Phone: [phone]; Fax: [phone]

## 2017-07-24 ENCOUNTER — Ambulatory Visit (INDEPENDENT_AMBULATORY_CARE_PROVIDER_SITE_OTHER): Payer: 59 | Admitting: Physician Assistant

## 2017-07-24 ENCOUNTER — Encounter: Payer: Self-pay | Admitting: Physician Assistant

## 2017-07-24 VITALS — BP 162/88 | HR 68 | Ht 65.0 in | Wt 179.0 lb

## 2017-07-24 DIAGNOSIS — I5042 Chronic combined systolic (congestive) and diastolic (congestive) heart failure: Secondary | ICD-10-CM | POA: Diagnosis not present

## 2017-07-24 DIAGNOSIS — I429 Cardiomyopathy, unspecified: Secondary | ICD-10-CM | POA: Diagnosis not present

## 2017-07-24 DIAGNOSIS — I130 Hypertensive heart and chronic kidney disease with heart failure and stage 1 through stage 4 chronic kidney disease, or unspecified chronic kidney disease: Secondary | ICD-10-CM | POA: Diagnosis not present

## 2017-07-24 LAB — BASIC METABOLIC PANEL
BUN/Creatinine Ratio: 8 — ABNORMAL LOW (ref 9–23)
BUN: 51 mg/dL — ABNORMAL HIGH (ref 6–24)
CO2: 18 mmol/L — ABNORMAL LOW (ref 20–29)
CREATININE: 6.48 mg/dL — AB (ref 0.57–1.00)
Calcium: 9.4 mg/dL (ref 8.7–10.2)
Chloride: 107 mmol/L — ABNORMAL HIGH (ref 96–106)
GFR, EST AFRICAN AMERICAN: 8 mL/min/{1.73_m2} — AB (ref >59)
GFR, EST NON AFRICAN AMERICAN: 7 mL/min/{1.73_m2} — AB (ref >59)
Glucose: 98 mg/dL (ref 65–99)
POTASSIUM: 4.3 mmol/L (ref 3.5–5.2)
Sodium: 140 mmol/L (ref 134–144)

## 2017-07-24 NOTE — Patient Instructions (Signed)
Medication Instructions:  Your physician recommends that you continue on your current medications as directed. Please refer to the Current Medication list given to you today.   Labwork: Lab work to be done today--BMP  Testing/Procedures: none  Follow-Up: Your physician recommends that you schedule a follow-up appointment in: 2-3 weeks in hypertension clinic  Your physician wants you to follow-up in: 6 months with Dr. Johann Capers will receive a reminder letter in the mail two months in advance. If you don't receive a letter, please call our office to schedule the follow-up appointment.     Please call your kidney doctor to reschedule your appointment in their office.      If you need a refill on your cardiac medications before your next appointment, please call your pharmacy.

## 2017-07-25 ENCOUNTER — Telehealth: Payer: Self-pay | Admitting: *Deleted

## 2017-07-25 NOTE — Telephone Encounter (Signed)
error 

## 2017-07-30 ENCOUNTER — Inpatient Hospital Stay: Payer: 59 | Attending: Hematology & Oncology

## 2017-07-30 ENCOUNTER — Telehealth: Payer: Self-pay | Admitting: *Deleted

## 2017-07-30 ENCOUNTER — Encounter: Payer: Self-pay | Admitting: Family

## 2017-07-30 ENCOUNTER — Other Ambulatory Visit: Payer: Self-pay

## 2017-07-30 ENCOUNTER — Inpatient Hospital Stay (HOSPITAL_BASED_OUTPATIENT_CLINIC_OR_DEPARTMENT_OTHER): Payer: 59 | Admitting: Family

## 2017-07-30 VITALS — BP 164/76 | HR 77 | Temp 98.4°F | Wt 179.1 lb

## 2017-07-30 DIAGNOSIS — Z79899 Other long term (current) drug therapy: Secondary | ICD-10-CM

## 2017-07-30 DIAGNOSIS — R768 Other specified abnormal immunological findings in serum: Secondary | ICD-10-CM | POA: Diagnosis not present

## 2017-07-30 DIAGNOSIS — D472 Monoclonal gammopathy: Secondary | ICD-10-CM

## 2017-07-30 DIAGNOSIS — K5900 Constipation, unspecified: Secondary | ICD-10-CM | POA: Diagnosis not present

## 2017-07-30 LAB — CBC WITH DIFFERENTIAL (CANCER CENTER ONLY)
Basophils Absolute: 0.1 10*3/uL (ref 0.0–0.1)
Basophils Relative: 1 %
EOS ABS: 0.1 10*3/uL (ref 0.0–0.5)
Eosinophils Relative: 2 %
HCT: 30 % — ABNORMAL LOW (ref 34.8–46.6)
HEMOGLOBIN: 9.6 g/dL — AB (ref 11.6–15.9)
Lymphocytes Relative: 23 %
Lymphs Abs: 1.7 10*3/uL (ref 0.9–3.3)
MCH: 23.4 pg — ABNORMAL LOW (ref 26.0–34.0)
MCHC: 32 g/dL (ref 32.0–36.0)
MCV: 73 fL — ABNORMAL LOW (ref 81.0–101.0)
Monocytes Absolute: 0.6 10*3/uL (ref 0.1–0.9)
Monocytes Relative: 8 %
NEUTROS PCT: 66 %
Neutro Abs: 4.9 10*3/uL (ref 1.5–6.5)
Platelet Count: 199 10*3/uL (ref 145–400)
RBC: 4.11 MIL/uL (ref 3.70–5.32)
RDW: 15.7 % (ref 11.1–15.7)
WBC: 7.4 10*3/uL (ref 3.9–10.0)

## 2017-07-30 LAB — CMP (CANCER CENTER ONLY)
ALBUMIN: 3.6 g/dL (ref 3.5–5.0)
ALK PHOS: 104 U/L — AB (ref 26–84)
ALT: 21 U/L (ref 10–47)
AST: 19 U/L (ref 11–38)
Anion gap: 10 (ref 5–15)
BUN: 56 mg/dL — AB (ref 7–22)
CHLORIDE: 106 mmol/L (ref 98–108)
CO2: 22 mmol/L (ref 18–33)
CREATININE: 6.8 mg/dL — AB (ref 0.60–1.20)
Calcium: 9.2 mg/dL (ref 8.0–10.3)
GLUCOSE: 125 mg/dL — AB (ref 73–118)
Potassium: 4.3 mmol/L (ref 3.3–4.7)
SODIUM: 138 mmol/L (ref 128–145)
Total Bilirubin: 0.7 mg/dL (ref 0.2–1.6)
Total Protein: 7.8 g/dL (ref 6.4–8.1)

## 2017-07-30 NOTE — Telephone Encounter (Signed)
Critical Value Creatinine 6.8 Laverna Peace NP notified. No orders at this time

## 2017-07-30 NOTE — Progress Notes (Signed)
Hematology and Oncology Follow Up Visit  Mallory Collier [id number] December 15, 1970 47 y.o. 07/30/2017   Principle Diagnosis:  MGUS  Current Therapy:   Observation   Interim History: Mallory Collier is here today for follow-up. She is doing quite well. She is currently on her cycles and says this always causes her some mild fatigue. Her cycle is regular and heavy.  She has had no other bleeding, no bruising or petechiae.  She states that she did not need an AV fistula or dialysis but it still following up with renal.  Her creatinine today is 6.80. She is making urine and has no issues with fluid retention.  No fever, chills, n/v, cough, rash, dizziness, SOB, chest pain, palpitations, abdominal pain or changes in bowel or bladder habits.  She is taking an iron supplement twice daily and this will give her constipation off and on.  No lymphadenopathy noted on exam.  No swelling, tenderness, numbness or tingling in her extremities.  She has a good appetite and is staying well hydrated. Her weight is stable.   ECOG Performance Status: 0 - Asymptomatic  Medications:  Allergies as of 07/30/2017   No Known Allergies     Medication List        Accurate as of 07/30/17  2:36 PM. Always use your most recent med list.          amLODipine 10 MG tablet Commonly known as:  NORVASC Take 1 tablet (10 mg total) by mouth daily.   carvedilol 25 MG tablet Commonly known as:  COREG TAKE 1 TABLET BY MOUTH TWICE DAILY   ferrous sulfate 325 (65 FE) MG tablet Take 1 tablet (325 mg total) by mouth 2 (two) times daily with a meal.   hydrALAZINE 100 MG tablet Commonly known as:  APRESOLINE Take 1 tablet (100 mg total) by mouth every 8 (eight) hours.   isosorbide mononitrate 60 MG 24 hr tablet Commonly known as:  IMDUR Take 1 tablet (60 mg total) by mouth daily.   sodium bicarbonate 650 MG tablet Take 650 mg by mouth 4 (four) times daily.   vitamin C 250 MG tablet Commonly known as:  ASCORBIC  ACID Take 1 tablet (250 mg total) by mouth 2 (two) times daily.       Allergies: No Known Allergies  Past Medical History, Surgical history, Social history, and Family History were reviewed and updated.  Review of Systems: All other 10 point review of systems is negative.   Physical Exam:  weight is 179 lb 1.9 oz (81.2 kg). Her oral temperature is 98.4 F (36.9 C). Her blood pressure is 164/76 (abnormal) and her pulse is 77. Her oxygen saturation is 99%.   Wt Readings from Last 3 Encounters:  07/30/17 179 lb 1.9 oz (81.2 kg)  07/24/17 179 lb (81.2 kg)  02/25/17 171 lb (77.6 kg)    Ocular: Sclerae unicteric, pupils equal, round and reactive to light Ear-nose-throat: Oropharynx clear, dentition fair Lymphatic: No cervical, supraclavicular or axillary adenopathy Lungs no rales or rhonchi, good excursion bilaterally Heart regular rate and rhythm, no murmur appreciated Abd soft, nontender, positive bowel sounds, no liver or spleen tip palpated on exam, no fluid wave  MSK no focal spinal tenderness, no joint edema Neuro: non-focal, well-oriented, appropriate affect Breasts: Deferred   Lab Results  Component Value Date   WBC 7.4 07/30/2017   HGB 9.6 (L) 07/30/2017   HCT 30.0 (L) 07/30/2017   MCV 73.0 (L) 07/30/2017   PLT 199 07/30/2017  Lab Results  Component Value Date   FERRITIN 21 10/27/2016   IRON 26 (L) 10/27/2016   TIBC 402 10/27/2016   UIBC 376 10/27/2016   IRONPCTSAT 6 (L) 10/27/2016   Lab Results  Component Value Date   RETICCTPCT 2.0 10/27/2016   RBC 4.11 07/30/2017   Lab Results  Component Value Date   KAPLAMBRATIO 13.96 (H) 01/22/2017   Lab Results  Component Value Date   IGGSERUM 1,600 01/22/2017   IGMSERUM 48 01/22/2017   Lab Results  Component Value Date   TOTALPROTELP 7.5 10/27/2016   ALBUMINELP 3.9 10/27/2016   A1GS 0.3 10/27/2016   A2GS 0.7 10/27/2016   BETS 1.2 10/27/2016   GAMS 1.4 10/27/2016   MSPIKE Not Observed 01/22/2017   SPEI  Comment 10/27/2016     Chemistry      Component Value Date/Time   NA 140 07/24/2017 0938   NA 145 01/22/2017 0838   K 4.3 07/24/2017 0938   K 4.0 01/22/2017 0838   CL 107 (H) 07/24/2017 0938   CL 109 (H) 01/22/2017 0838   CO2 18 (L) 07/24/2017 0938   CO2 22 01/22/2017 0838   BUN 51 (H) 07/24/2017 0938   BUN 56 (H) 01/22/2017 0838   CREATININE 6.48 (H) 07/24/2017 0938   CREATININE 6.7 (HH) 01/22/2017 0838      Component Value Date/Time   CALCIUM 9.4 07/24/2017 0938   CALCIUM 9.7 01/22/2017 0838   ALKPHOS 98 (H) 01/22/2017 0838   AST 19 01/22/2017 0838   ALT 17 01/22/2017 0838   BILITOT 0.60 01/22/2017 0838      Impression and Plan: Mallory Collier is a very pleasant 47 yo African American female with MGUS. She has had no M-spike detected since we have seen her. Results for lab work today are stable.  She is doing well and has not yet started dialysis.  We will plan to see her back in another 6 months for follow-up.  She will contact our office with any questions or concerns. We can certainly see her sooner if need be.   Laverna Peace, NP 6/11/20192:36 PM

## 2017-07-31 ENCOUNTER — Encounter: Payer: Self-pay | Admitting: *Deleted

## 2017-07-31 LAB — KAPPA/LAMBDA LIGHT CHAINS
KAPPA FREE LGHT CHN: 814 mg/L — AB (ref 3.3–19.4)
Kappa, lambda light chain ratio: 19.38 — ABNORMAL HIGH (ref 0.26–1.65)
LAMDA FREE LIGHT CHAINS: 42 mg/L — AB (ref 5.7–26.3)

## 2017-07-31 LAB — LACTATE DEHYDROGENASE: LDH: 191 U/L (ref 125–245)

## 2017-07-31 LAB — IGG, IGA, IGM
IGA: 262 mg/dL (ref 87–352)
IgG (Immunoglobin G), Serum: 1545 mg/dL (ref 700–1600)
IgM (Immunoglobulin M), Srm: 40 mg/dL (ref 26–217)

## 2017-08-01 LAB — PROTEIN ELECTROPHORESIS, SERUM, WITH REFLEX
A/G RATIO SPE: 1.2 (ref 0.7–1.7)
ALBUMIN ELP: 4 g/dL (ref 2.9–4.4)
ALPHA-1-GLOBULIN: 0.2 g/dL (ref 0.0–0.4)
Alpha-2-Globulin: 0.7 g/dL (ref 0.4–1.0)
BETA GLOBULIN: 1 g/dL (ref 0.7–1.3)
GAMMA GLOBULIN: 1.5 g/dL (ref 0.4–1.8)
Globulin, Total: 3.4 g/dL (ref 2.2–3.9)
Total Protein ELP: 7.4 g/dL (ref 6.0–8.5)

## 2017-08-05 ENCOUNTER — Other Ambulatory Visit: Payer: Self-pay | Admitting: Family

## 2017-08-05 ENCOUNTER — Telehealth: Payer: Self-pay | Admitting: *Deleted

## 2017-08-05 DIAGNOSIS — R768 Other specified abnormal immunological findings in serum: Secondary | ICD-10-CM

## 2017-08-05 DIAGNOSIS — D472 Monoclonal gammopathy: Secondary | ICD-10-CM

## 2017-08-05 NOTE — Telephone Encounter (Addendum)
Patient is aware of need to come to the office to pick up supplies.   ----- Message from Eliezer Bottom, NP sent at 08/05/2017  1:31 PM EDT ----- Needs to come in to collect supplies and get instructions for 24 hour urine specimen. Order placed. Thank you!   Sarah  ----- Message ----- From: Buel Ream, [facility] [place] Sent: 07/30/2017   2:35 PM To: Eliezer Bottom, NP

## 2017-08-16 ENCOUNTER — Ambulatory Visit: Payer: 59 | Admitting: Pharmacist

## 2017-08-16 NOTE — Progress Notes (Deleted)
Patient ID: [name]                 DOB: Nov 03, 1970                      MRN: [id number]     HPI: Mallory Collier is a 47 y.o. female patient of Dr Meda Coffee referred by Ermalinda Barrios, PA to HTN clinic. PMH is significant for HTN, DM, CKD stage V, and medical nonadherence. I have seen pt previously in HTN clinic ~8 months ago, she did not show for her follow up visit. She was admitted to [facility] for [facility] emergency with renal failure and CHF with LVEF of 20-25% on echo. SCr > 6 however there were no plans for dialysis. LVEF has since improved to 45-50% on 02/2017 echo. SCr from 2 weeks ago elevated at 6.8. At her most recent cardiology visit 3 weeks ago, BP was elevated at 162/88, although pt had taken her BP medications 10 minutes prior to her visit. No medication changes were made and she presents today for follow up.  Did she see renal? Any plans for dialysis?   Clonidine low dose - monitor HR  Pt presents today in good spirits with her husband. She used to check her BP but lost her cuff. She denies dizziness, blurred vision, falls, or headache. She avoids salt and caffeine and is trying to increase her walking.  Current HTN meds: amlodipine 10mg  daily, carvedilol 25mg  BID, hydralazine 100mg  TID, isosorbide 60mg  daily BP goal: <130/42mmHg  Family History: CHF in her father.  Social History: Denies tobacco, alcohol, or illicit drug use.  Diet: Does not add salt to food. Eats most meals at home. Likes grilled chicken, fruit, rice, vegetables. Uses Mrs Deliah Boston salt substitute. Does not drink any caffeine.  Exercise: Goal to walk every day.  Home BP readings: Lost her BP cuff.  Wt Readings from Last 3 Encounters:  07/30/17 179 lb 1.9 oz (81.2 kg)  07/24/17 179 lb (81.2 kg)  02/25/17 171 lb (77.6 kg)   BP Readings from Last 3 Encounters:  07/30/17 (!) 164/76  07/24/17 (!) 162/88  02/25/17 (!) 192/90   Pulse Readings from Last 3 Encounters:  07/30/17 77    07/24/17 68  02/25/17 80    Renal function: CrCl cannot be calculated (Unknown ideal weight.).  Past Medical History:  Diagnosis Date  . AKI (acute kidney injury) ([place]) 10/27/2016  . Cardiomyopathy ([place]) 11/06/2016  . CHF (congestive heart failure) ([place]) 10/27/2016  . Diabetes mellitus without complication ([place])   . Elevated troponin 10/27/2016  . Hypertension   . Hypertensive emergency 10/27/2016  . Microcytic hypochromic anemia 10/27/2016  . Noncompliance with medication regimen   . Stenosis of ureteropelvic junction (UPJ)   . Systolic and diastolic CHF, acute ([place])   . Type 2 diabetes mellitus with renal manifestations ([place]) 10/27/2016    Current Outpatient Medications on File Prior to Visit  Medication Sig Dispense Refill  . amLODipine (NORVASC) 10 MG tablet Take 1 tablet (10 mg total) by mouth daily. 90 tablet 3  . carvedilol (COREG) 25 MG tablet TAKE 1 TABLET BY MOUTH TWICE DAILY 60 tablet 5  . ferrous sulfate 325 (65 FE) MG tablet Take 1 tablet (325 mg total) by mouth 2 (two) times daily with a meal. 60 tablet 0  . hydrALAZINE (APRESOLINE) 100 MG tablet Take 1 tablet (100 mg total) by mouth every 8 (eight) hours. 270 tablet 1  . isosorbide mononitrate (IMDUR)  60 MG 24 hr tablet Take 1 tablet (60 mg total) by mouth daily. 90 tablet 3  . sodium bicarbonate 650 MG tablet Take 650 mg by mouth 4 (four) times daily.    . vitamin C (ASCORBIC ACID) 250 MG tablet Take 1 tablet (250 mg total) by mouth 2 (two) times daily. 60 tablet 0   No current facility-administered medications on file prior to visit.     No Known Allergies   Assessment/Plan:  1. Hypertension/HF medication optimization - BP is improved but remains above goal <130/20mmHg. Will switch labetalol to carvedilol due to data in HFrEF, however pt still has 2 weeks remaining on her labetalol rx while she is almost out of her hydralazine. Will increase hydralazine to 100mg  TID today and f/u in pharmacy clinic in 2 weeks with plans  to switch labetalol to carvedilol at that time. Unable to start ACEi or spironolactone due to SCr > 6 with patient not on dialysis.    Kasmira Cacioppo E. Kaelum Kissick, PharmD, CPP, [place] [address] [place], [place] [address] Phone: [phone]; Fax: [phone] 08/16/2017 7:37 AM

## 2017-11-03 ENCOUNTER — Other Ambulatory Visit: Payer: Self-pay | Admitting: Physician Assistant

## 2017-11-27 ENCOUNTER — Other Ambulatory Visit: Payer: Self-pay | Admitting: Physician Assistant

## 2018-01-29 ENCOUNTER — Inpatient Hospital Stay: Payer: 59 | Attending: Hematology & Oncology

## 2018-01-29 ENCOUNTER — Inpatient Hospital Stay: Payer: 59 | Admitting: Family

## 2018-03-16 ENCOUNTER — Other Ambulatory Visit: Payer: Self-pay | Admitting: Cardiology

## 2018-09-27 IMAGING — US US RENAL
1 series · 14 of 25 positions shown · non-contrast
Comparison: Report 03/13/2011

CLINICAL DATA: Acute renal failure

EXAM:
RENAL / URINARY TRACT ULTRASOUND COMPLETE

[Series 1: us renal · 0.23mm/px · 14 of 27 slices shown]
[im 1/27]
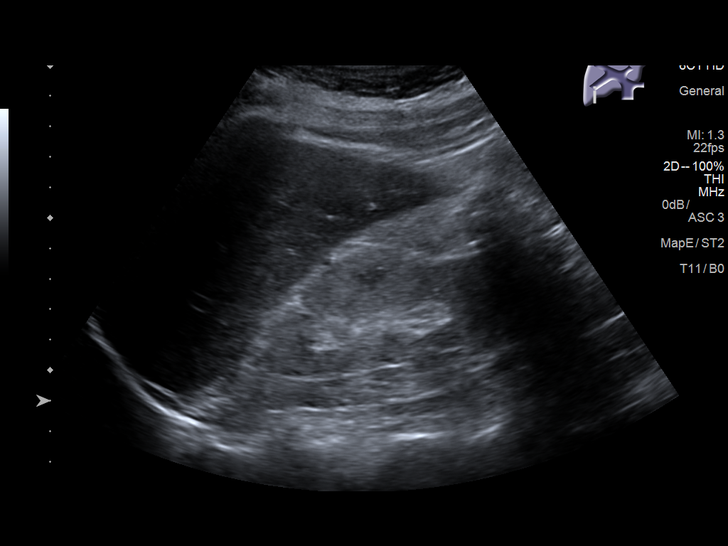
[im 3/27]
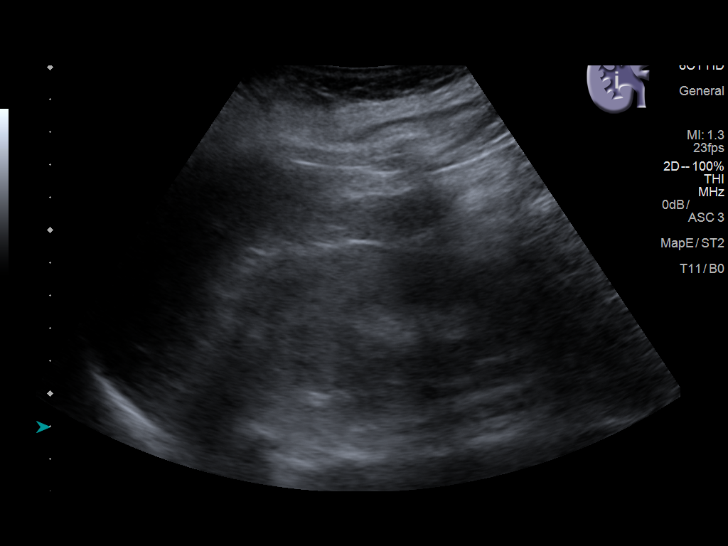
[im 5/27]
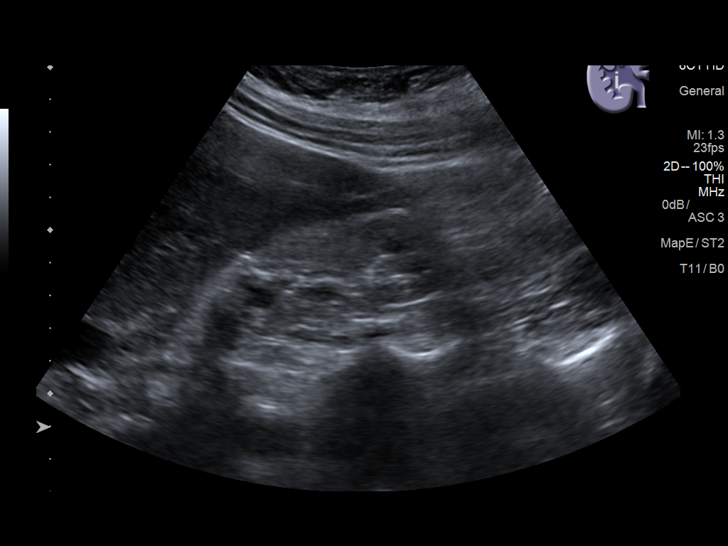
[im 7/27]
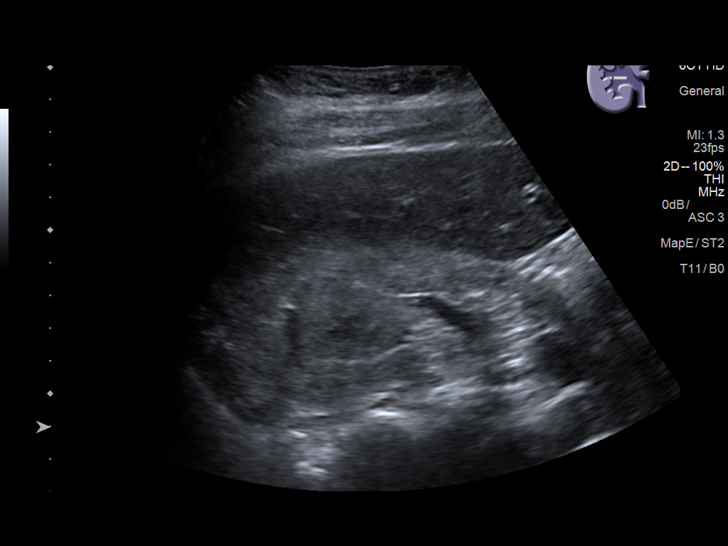
[im 9/27]
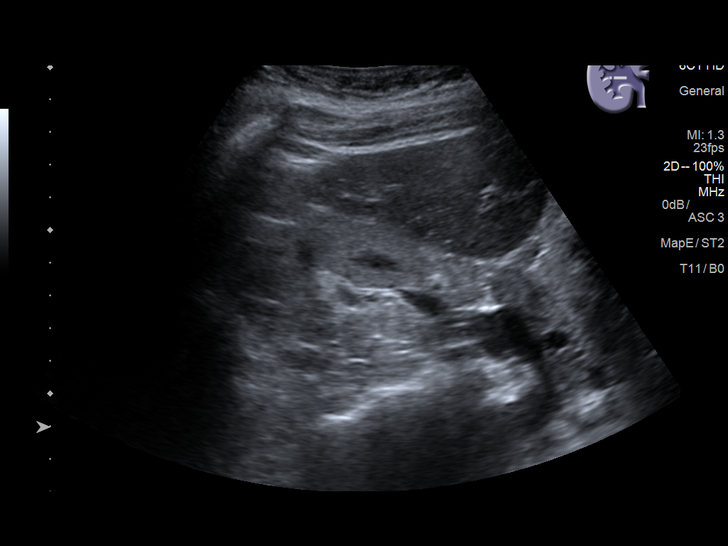
[im 10/27]
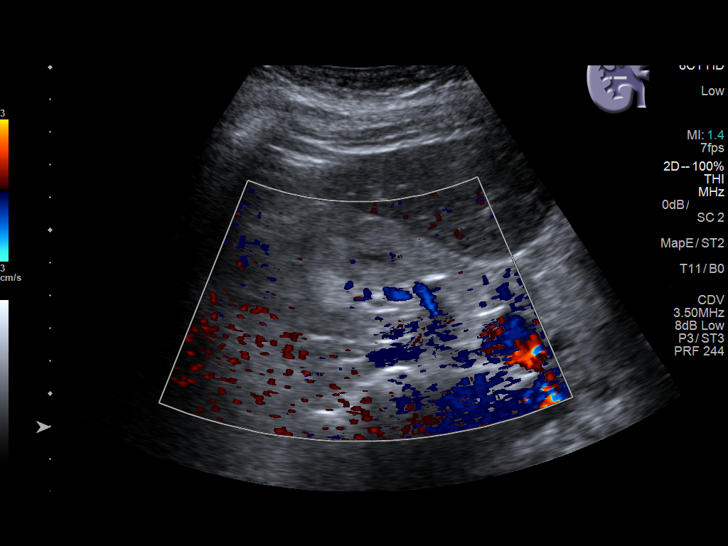
[im 12/27]
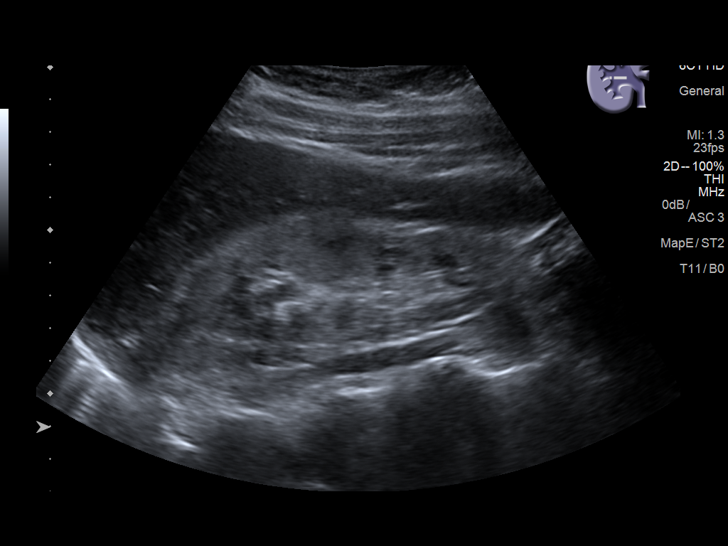
[im 15/27]
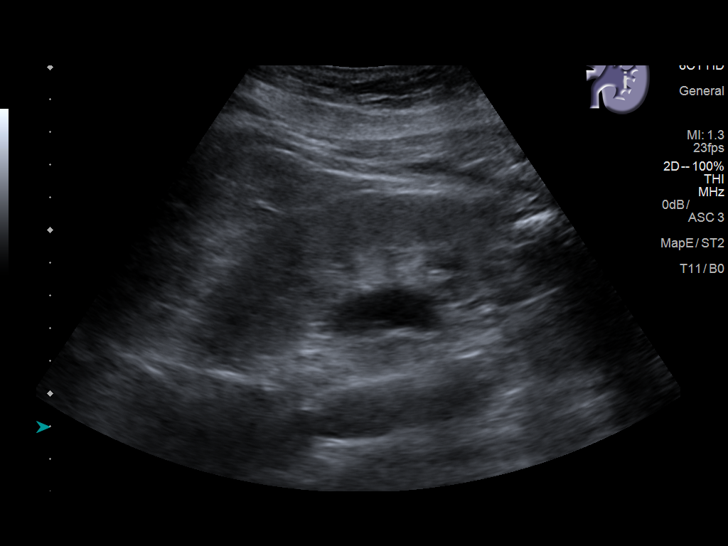
[im 17/27]
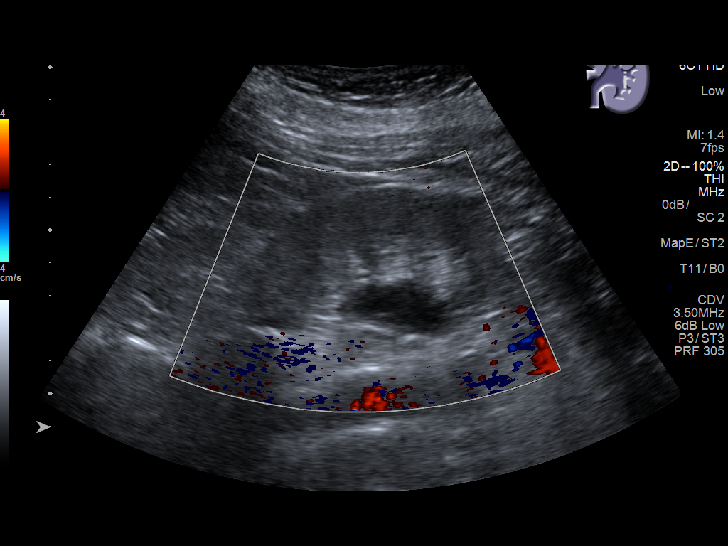
[im 18/27]
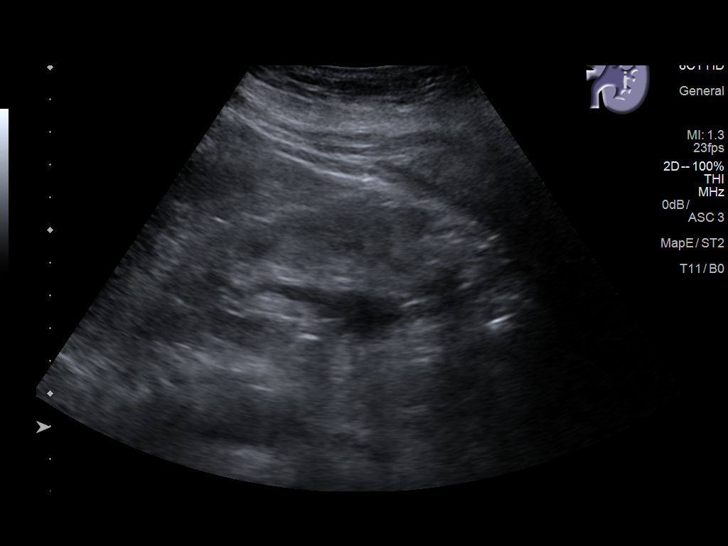
[im 20/27]
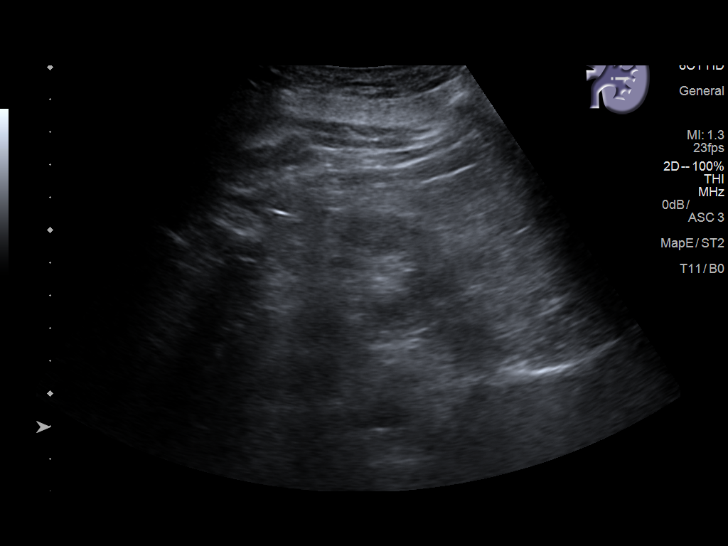
[im 22/27]
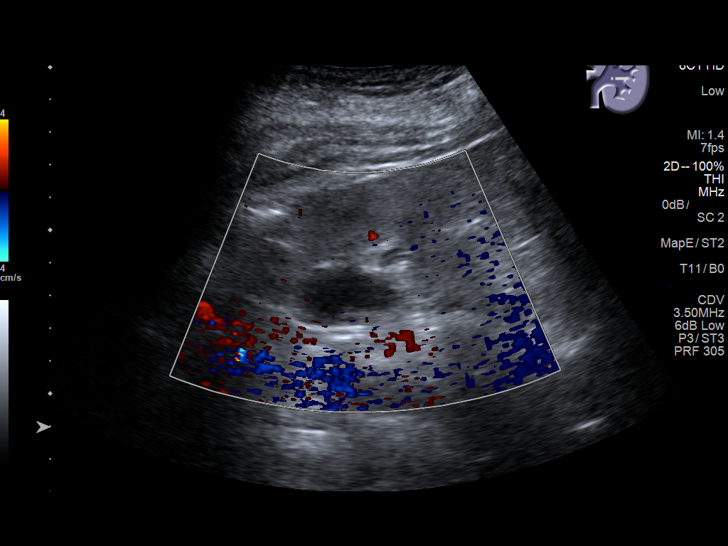
[im 24/27]
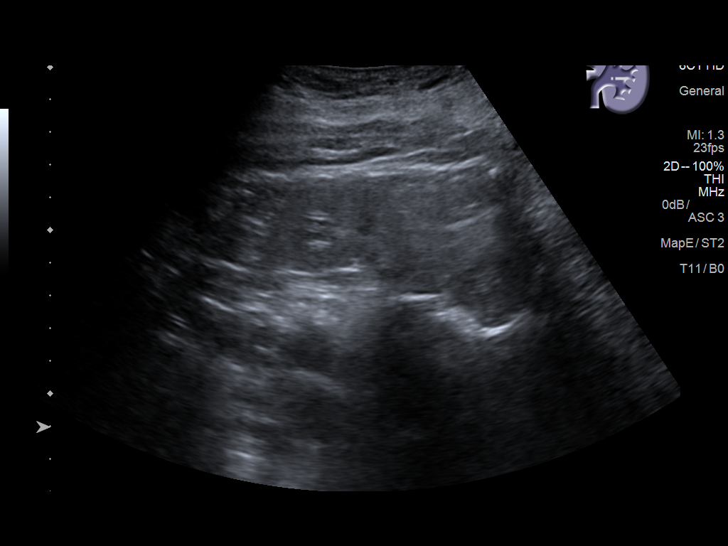
[im 27/27]
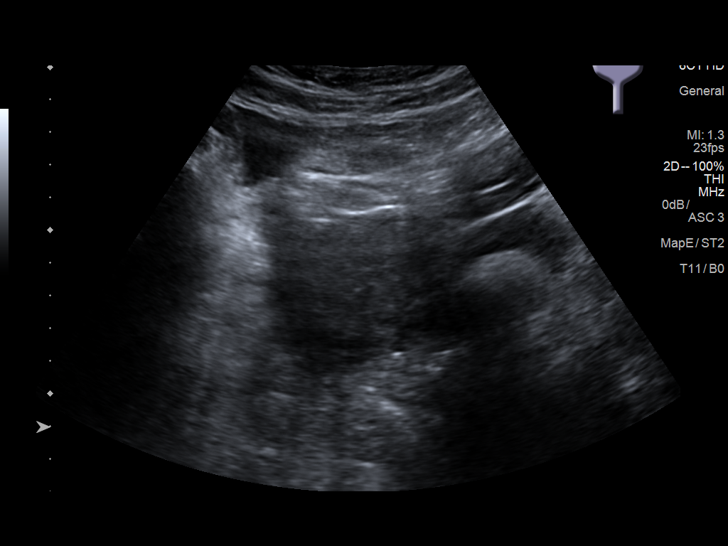

[14 of 25 positions shown; findings below may reference images not displayed]

FINDINGS: Right Kidney:

Length: 8.4 cm. Diffuse increased cortical echogenicity. Minimal
splaying of the pelvis and calices. No focal parenchymal abnormality

Left Kidney:

Length: 10.9 cm. Increased cortical echogenicity. Mild
hydronephrosis of probable extrarenal pelvis. No dilatation of the
calices.

Bladder:

Bladder is empty
IMPRESSION: 1. Increased cortical echogenicity bilaterally consistent with
medical renal disease
2. Minimal splaying of the right renal pelvis without frank
hydronephrosis. Mild left hydronephrosis of probable extrarenal
pelvis.
3. Empty bladder

## 2018-11-12 IMAGING — DX DG CHEST 1V PORT
1 series · 1 of 1 positions shown · non-contrast
Comparison: Chest x-ray a 02/07/2010.

CLINICAL DATA: 45-year-old female with history of acute onset of
hypertension today.

EXAM:
PORTABLE CHEST 1 VIEW

[chest ap]
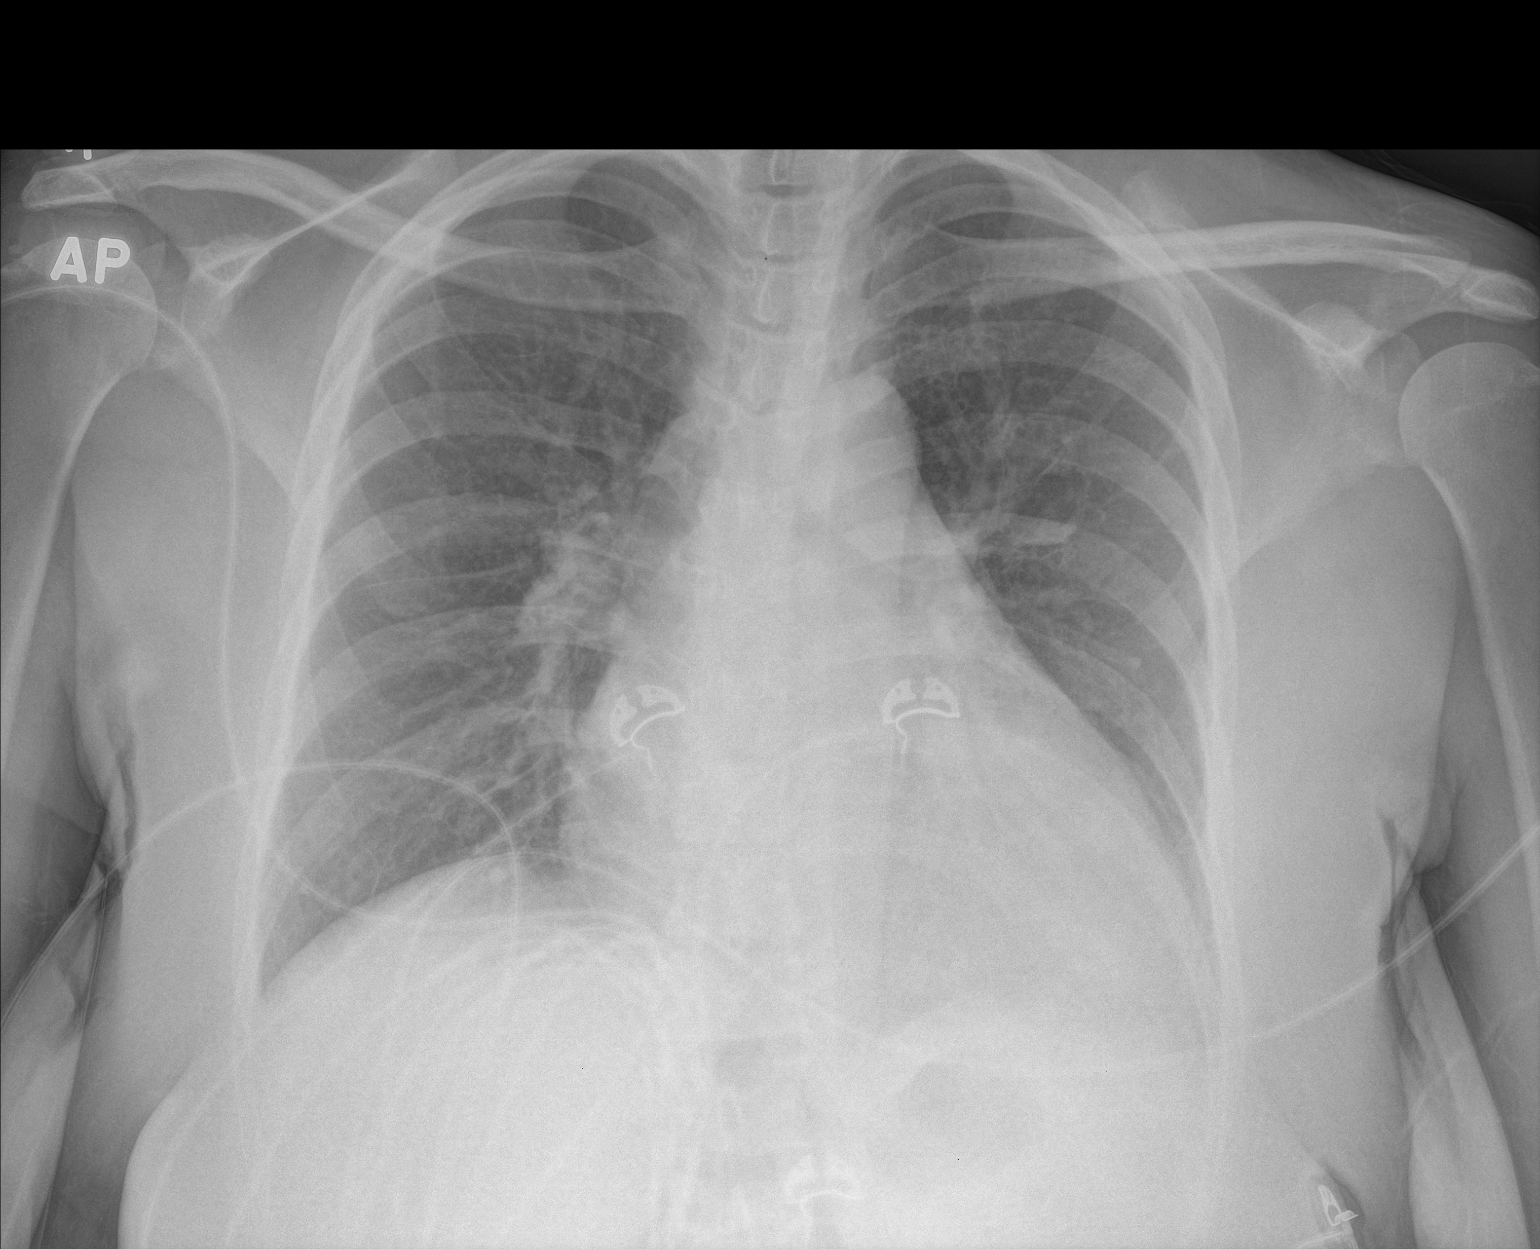

[1 of 1 positions shown; findings below may reference images not displayed]

FINDINGS: There is mild cephalization of the pulmonary vasculature and slight
indistinctness of the interstitial markings suggestive of mild
pulmonary edema. No acute consolidative airspace disease. No
definite pleural effusions. Mild cardiomegaly. The patient is
rotated to the left on today's exam, resulting in distortion of the
mediastinal contours and reduced diagnostic sensitivity and
specificity for mediastinal pathology.
IMPRESSION: 1. The appearance of the chest suggests very mild congestive heart
failure, as above.

## 2018-11-14 IMAGING — CT CT ABD-PELV W/O CM
2 of 4 series · 16 of 46 positions shown, 18 images · non-contrast
Comparison: None.

CLINICAL DATA: 45-year-old female with multiple organ failure in
the setting of hypertensive emergency an acute renal failure.

EXAM:
CT ABDOMEN AND PELVIS WITHOUT CONTRAST
TECHNIQUE: Multidetector CT imaging of the abdomen and pelvis was performed
following the standard protocol without IV contrast.

[Series 3: abd/ pelvis 5.0 i30f 2 · axial · 0.75mm/px · z∈[+680,+1100]mm · 13 of 92 slices shown, 15 images]
[im 4/92  soft-tissue]
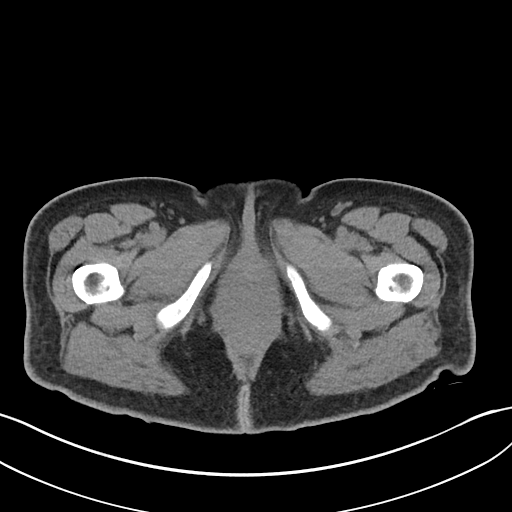
[im 4/92  bone]
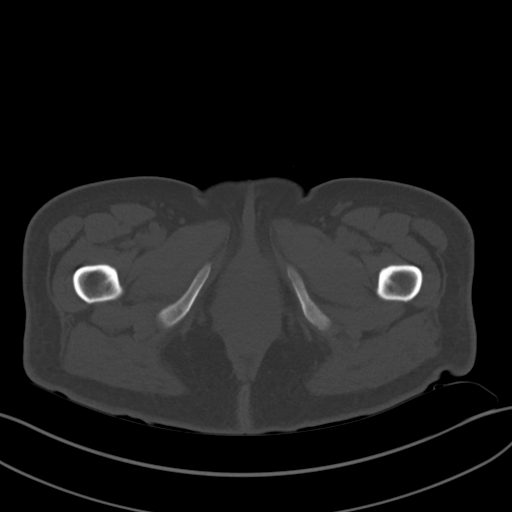
[im 11/92  soft-tissue]
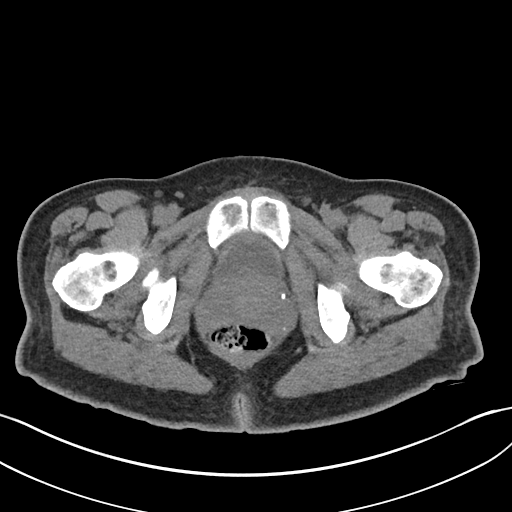
[im 19/92  soft-tissue]
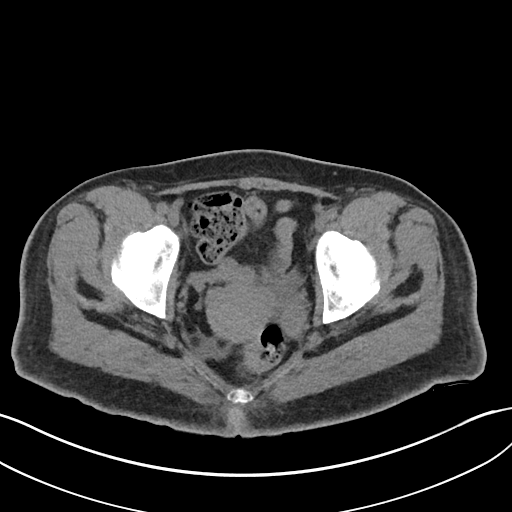
[im 26/92  soft-tissue]
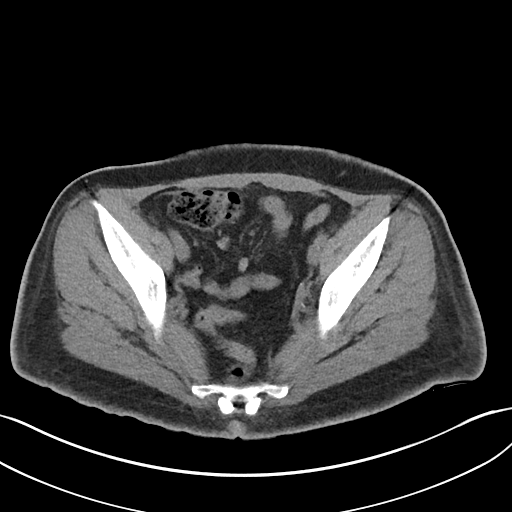
[im 33/92  soft-tissue]
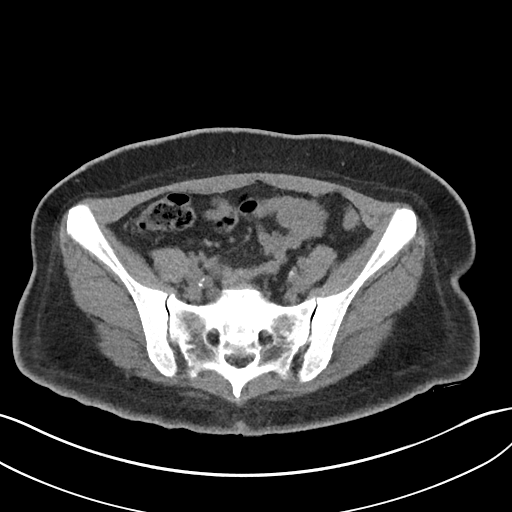
[im 41/92  soft-tissue]
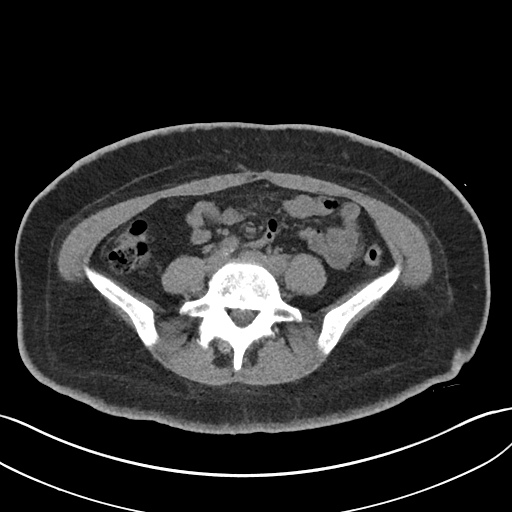
[im 48/92  soft-tissue]
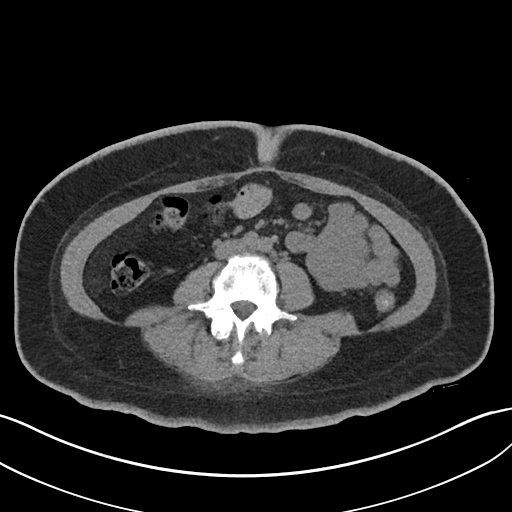
[im 51/92  soft-tissue]
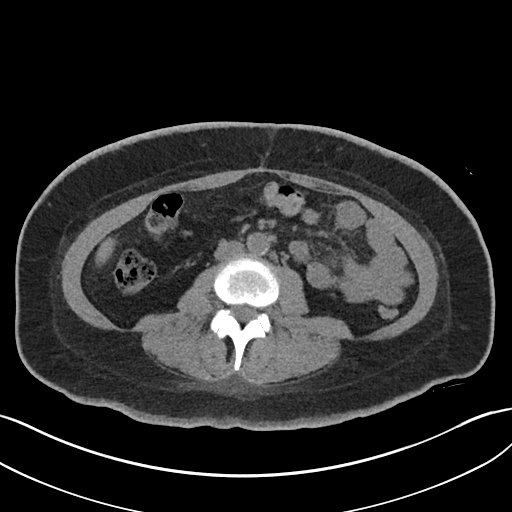
[im 59/92  soft-tissue]
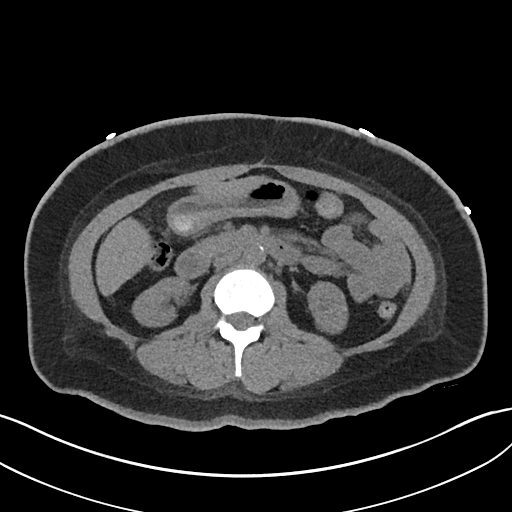
[im 59/92  bone]
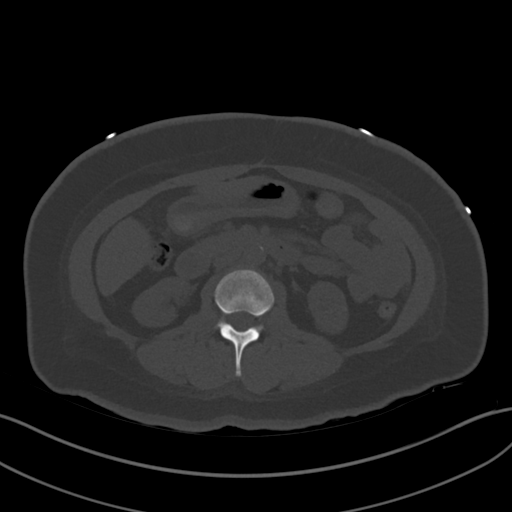
[im 66/92  soft-tissue]
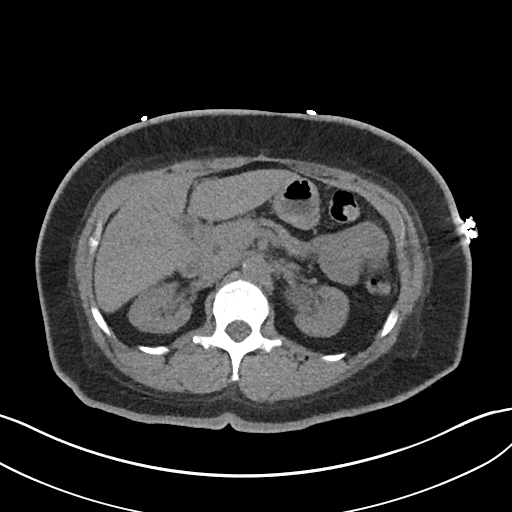
[im 73/92  soft-tissue]
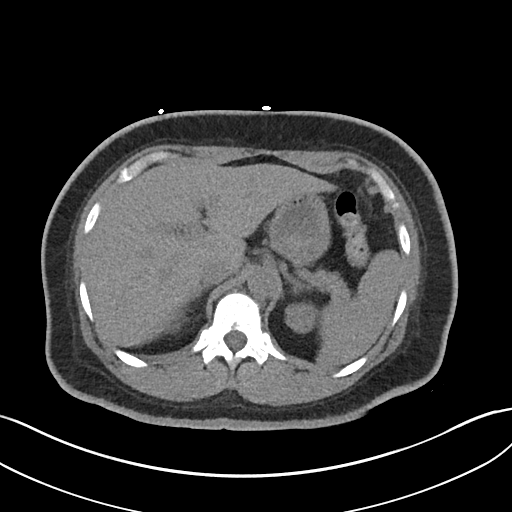
[im 81/92  soft-tissue]
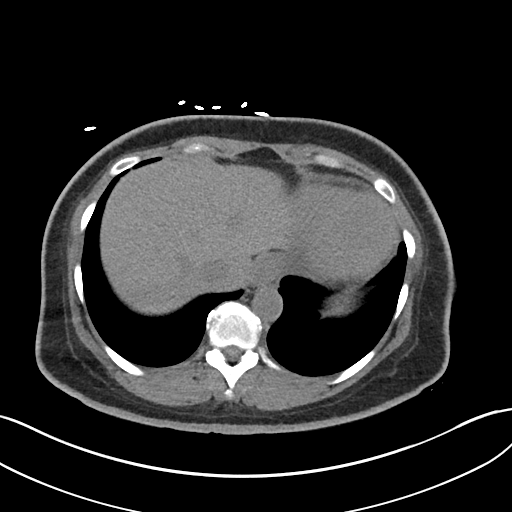
[im 88/92  soft-tissue]
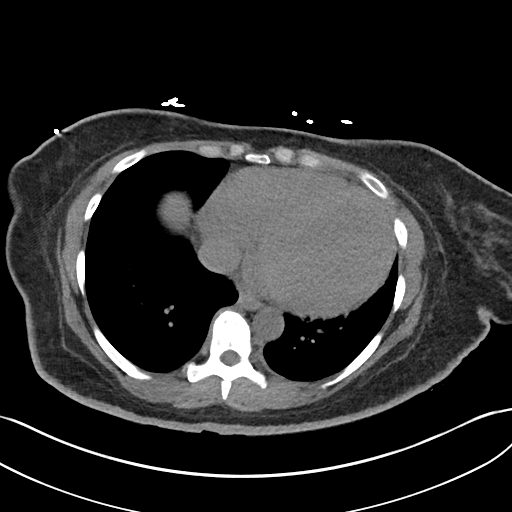

[Series 6: cor st · coronal · 0.89mm/px · 3 of 103 slices shown]
[im 35/103  soft-tissue]
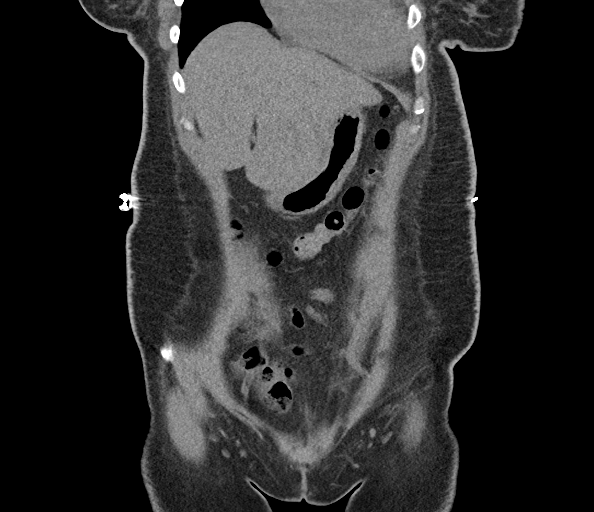
[im 46/103  soft-tissue]
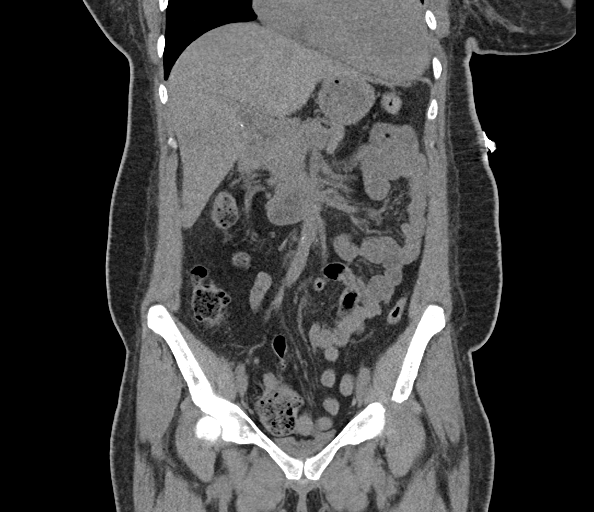
[im 57/103  soft-tissue]
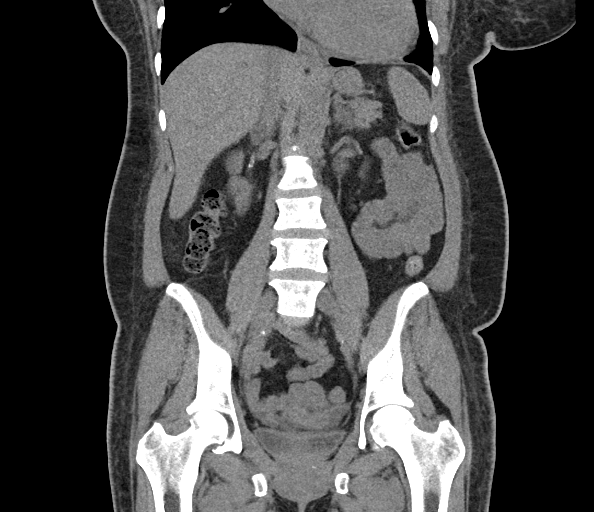

[16 of 46 positions shown; findings below may reference images not displayed]

FINDINGS: Lower chest: The visualized lower lungs are clear. Marked
cardiomegaly with left ventricular dilatation. No pericardial
effusion. Unremarkable visualized thoracic esophagus.

Hepatobiliary: No focal liver abnormality is seen. Status post
cholecystectomy. No biliary dilatation.

Pancreas: Unremarkable. No pancreatic ductal dilatation or
surrounding inflammatory changes.

Spleen: Normal in size without focal abnormality.

Adrenals/Urinary Tract: Unremarkable adrenal glands. The kidneys are
relatively small bilaterally. Left extrarenal pelvis with slight
dilatation. There may be mild underlying UPJ stenosis. No
nephrolithiasis. The bladder is decompressed.

Stomach/Bowel: No evidence of obstruction or focal bowel wall
thickening. Normal appendix in the right lower quadrant. The
terminal ileum is unremarkable.

Vascular/Lymphatic: Limited evaluation in the absence of intravenous
contrast. No aneurysm. Mild atherosclerotic vascular calcifications
along the abdominal aorta. No suspicious lymphadenopathy.

Reproductive: Uterus and bilateral adnexa are unremarkable.

Other: Trace free fluid in the pelvic cul-de-sac may be physiologic.

Musculoskeletal: No acute fracture or aggressive appearing lytic or
blastic osseous lesion. L4-L5 and L5-S1 facet arthropathy on the
right.
IMPRESSION: 1. No acute abnormality in the abdomen or pelvis.
2. Marked cardiomegaly with left ventricular dilatation.
3. Surgical changes of prior cholecystectomy.
4. Dilated left extrarenal pelvis. Suspect an element of underlying
UPJ stenosis.
5.  Aortic Atherosclerosis ([id number])
6. Right lower lumbar facet arthropathy.

## 2023-04-20 DEATH — deceased
